# Patient Record
Sex: Male | Born: 1965 | Hispanic: Yes | Marital: Married | Smoking: Never smoker
Health system: Southern US, Community
[De-identification: ages and names within clinical notes are randomized; demographics above are authoritative.]

## PROBLEM LIST (undated history)

## (undated) DIAGNOSIS — G44209 Tension-type headache, unspecified, not intractable: Secondary | ICD-10-CM

## (undated) DIAGNOSIS — Z87442 Personal history of urinary calculi: Secondary | ICD-10-CM

## (undated) DIAGNOSIS — K219 Gastro-esophageal reflux disease without esophagitis: Secondary | ICD-10-CM

## (undated) DIAGNOSIS — C499 Malignant neoplasm of connective and soft tissue, unspecified: Secondary | ICD-10-CM

## (undated) HISTORY — PX: WRIST SURGERY: SHX841

## (undated) HISTORY — DX: Tension-type headache, unspecified, not intractable: G44.209

## (undated) HISTORY — PX: KNEE ARTHROSCOPY: SUR90

## (undated) HISTORY — DX: Gastro-esophageal reflux disease without esophagitis: K21.9

## (undated) HISTORY — DX: Malignant neoplasm of connective and soft tissue, unspecified: C49.9

## (undated) HISTORY — DX: Personal history of urinary calculi: Z87.442

---

## 1998-12-18 DIAGNOSIS — C499 Malignant neoplasm of connective and soft tissue, unspecified: Secondary | ICD-10-CM

## 1998-12-18 HISTORY — DX: Malignant neoplasm of connective and soft tissue, unspecified: C49.9

## 1999-12-19 DIAGNOSIS — Z859 Personal history of malignant neoplasm, unspecified: Secondary | ICD-10-CM | POA: Insufficient documentation

## 1999-12-19 HISTORY — PX: COLON SURGERY: SHX602

## 2005-05-01 ENCOUNTER — Ambulatory Visit: Payer: Self-pay | Admitting: Specialist

## 2005-10-18 ENCOUNTER — Ambulatory Visit: Payer: Self-pay | Admitting: Physician Assistant

## 2006-05-07 ENCOUNTER — Ambulatory Visit: Payer: Self-pay | Admitting: Oncology

## 2006-05-18 ENCOUNTER — Ambulatory Visit: Payer: Self-pay | Admitting: Oncology

## 2006-07-16 ENCOUNTER — Ambulatory Visit: Payer: Self-pay | Admitting: Oncology

## 2006-08-02 ENCOUNTER — Ambulatory Visit: Payer: Self-pay | Admitting: Oncology

## 2006-08-18 ENCOUNTER — Ambulatory Visit: Payer: Self-pay | Admitting: Oncology

## 2006-10-31 ENCOUNTER — Ambulatory Visit: Payer: Self-pay | Admitting: Oncology

## 2006-11-17 ENCOUNTER — Ambulatory Visit: Payer: Self-pay | Admitting: Oncology

## 2007-02-07 ENCOUNTER — Ambulatory Visit: Payer: Self-pay | Admitting: Oncology

## 2007-02-16 ENCOUNTER — Ambulatory Visit: Payer: Self-pay | Admitting: Oncology

## 2007-03-01 ENCOUNTER — Ambulatory Visit: Payer: Self-pay | Admitting: Gastroenterology

## 2007-11-21 ENCOUNTER — Ambulatory Visit: Payer: Self-pay | Admitting: Urology

## 2008-07-18 ENCOUNTER — Ambulatory Visit: Payer: Self-pay | Admitting: Oncology

## 2008-07-23 ENCOUNTER — Ambulatory Visit: Payer: Self-pay | Admitting: Oncology

## 2008-08-18 ENCOUNTER — Ambulatory Visit: Payer: Self-pay | Admitting: Oncology

## 2009-04-23 ENCOUNTER — Emergency Department: Payer: Self-pay | Admitting: Unknown Physician Specialty

## 2009-05-18 ENCOUNTER — Ambulatory Visit: Payer: Self-pay | Admitting: Oncology

## 2009-06-11 ENCOUNTER — Ambulatory Visit: Payer: Self-pay | Admitting: Oncology

## 2009-06-17 ENCOUNTER — Ambulatory Visit: Payer: Self-pay | Admitting: Oncology

## 2009-07-18 ENCOUNTER — Ambulatory Visit: Payer: Self-pay | Admitting: Oncology

## 2009-12-18 ENCOUNTER — Ambulatory Visit: Payer: Self-pay | Admitting: Oncology

## 2010-01-07 ENCOUNTER — Ambulatory Visit: Payer: Self-pay | Admitting: Oncology

## 2010-01-18 ENCOUNTER — Ambulatory Visit: Payer: Self-pay | Admitting: Oncology

## 2010-03-18 ENCOUNTER — Ambulatory Visit: Payer: Self-pay | Admitting: Oncology

## 2010-04-02 ENCOUNTER — Inpatient Hospital Stay: Payer: Self-pay | Admitting: Internal Medicine

## 2010-04-14 ENCOUNTER — Ambulatory Visit: Payer: Self-pay | Admitting: Oncology

## 2010-04-17 ENCOUNTER — Ambulatory Visit: Payer: Self-pay | Admitting: Oncology

## 2010-10-27 ENCOUNTER — Ambulatory Visit: Payer: Self-pay | Admitting: Oncology

## 2010-11-17 ENCOUNTER — Ambulatory Visit: Payer: Self-pay | Admitting: Oncology

## 2011-12-02 IMAGING — CR DG ABDOMEN 2V
1 series · 3 of 3 positions shown · non-contrast
Comparison: none

REASON FOR EXAM: F/U partial SBO.  (Please complete before 8 AM). Thanks.
COMMENTS:

[Series 1: view not recorded · 0.17mm/px · 3 of 3 slices shown]
[im 1/3]
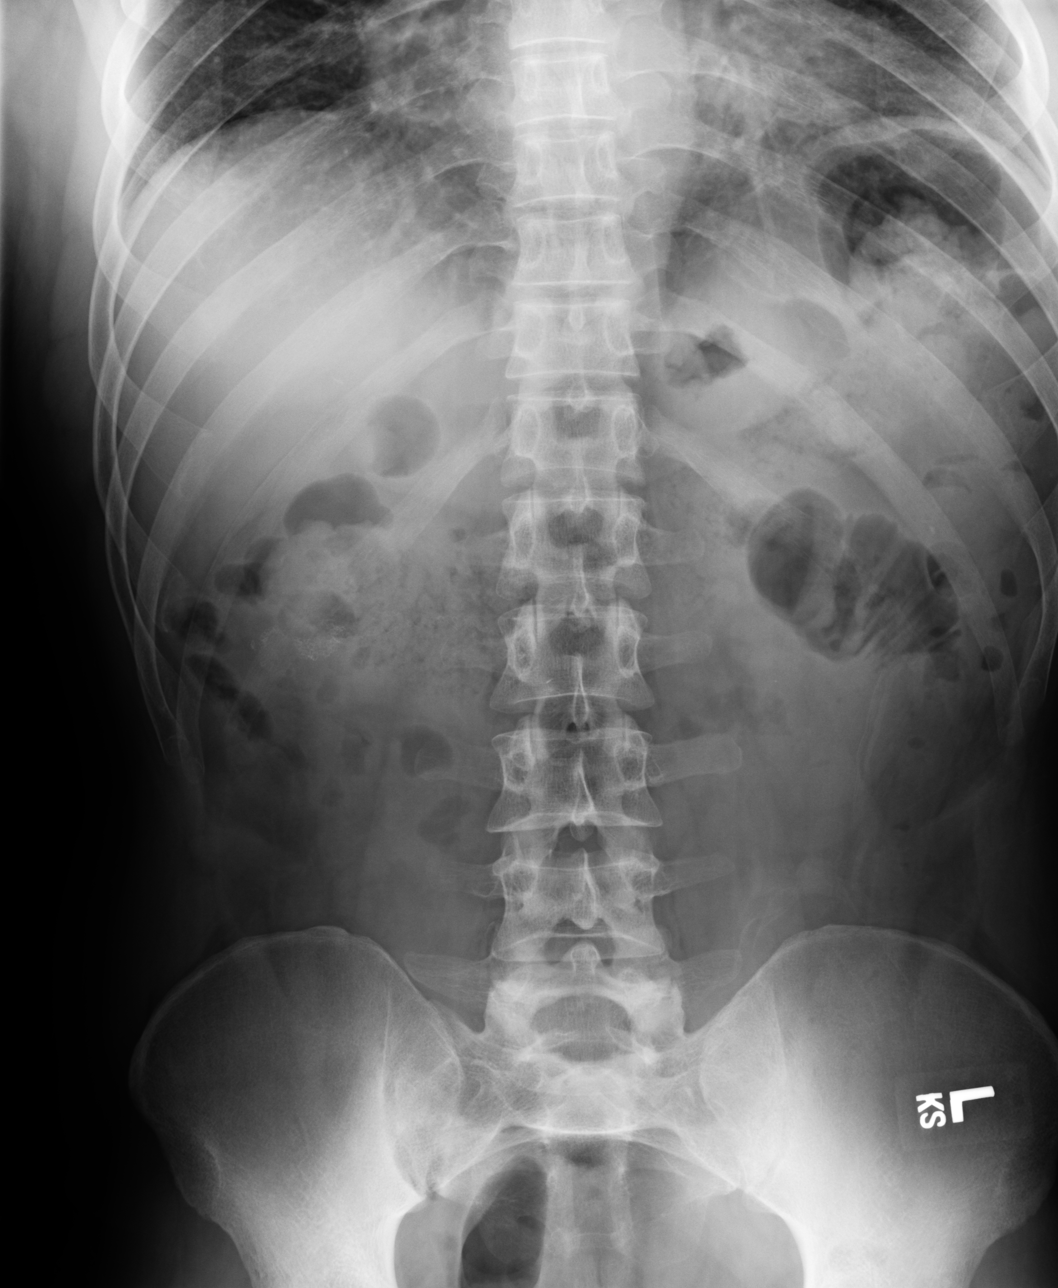
[im 2/3]
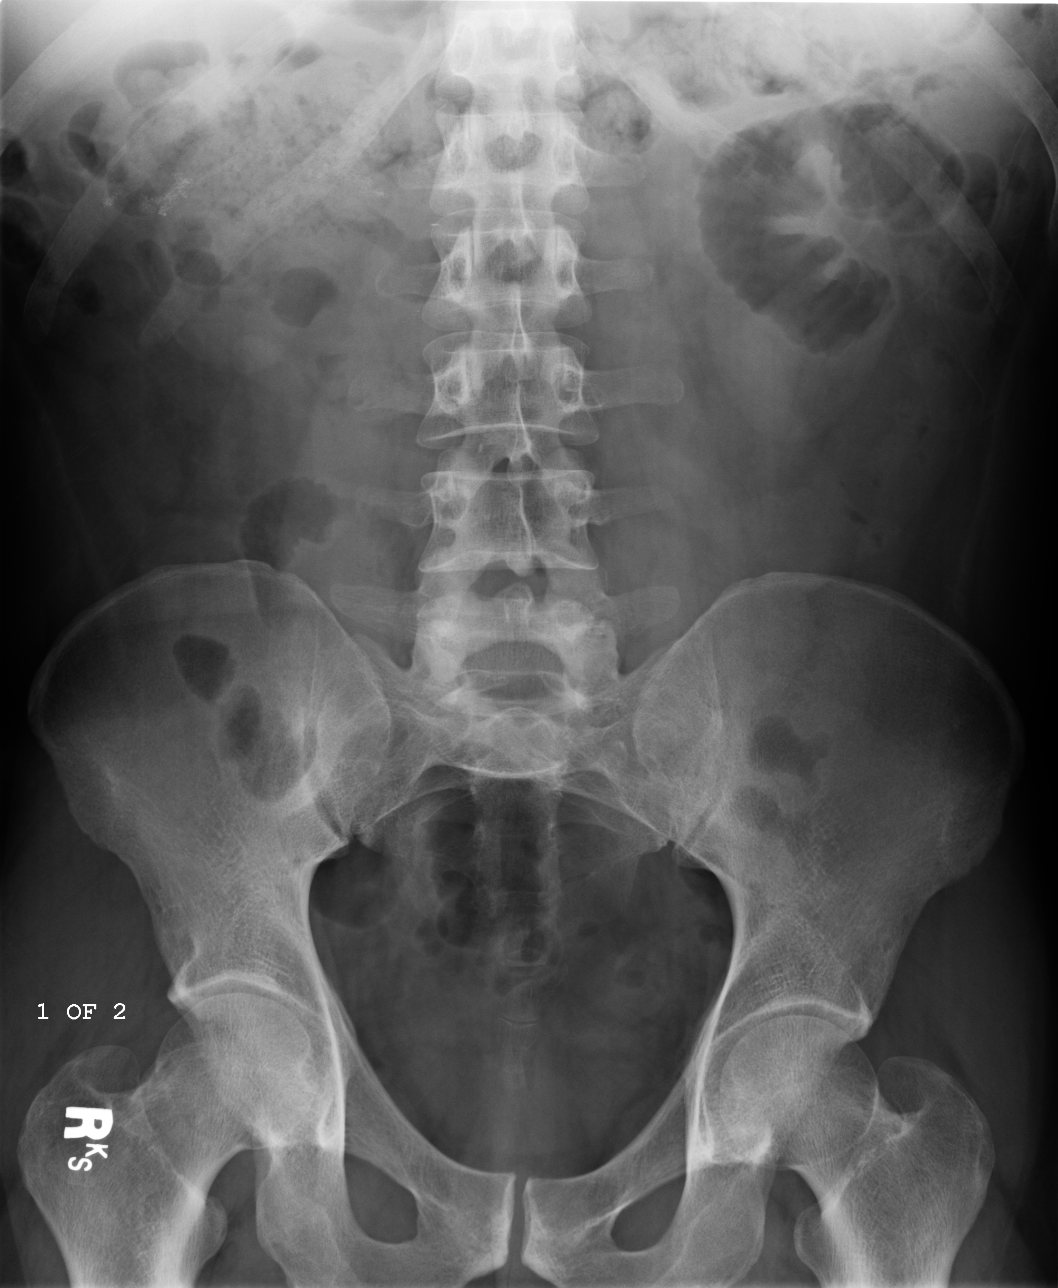
[im 3/3]
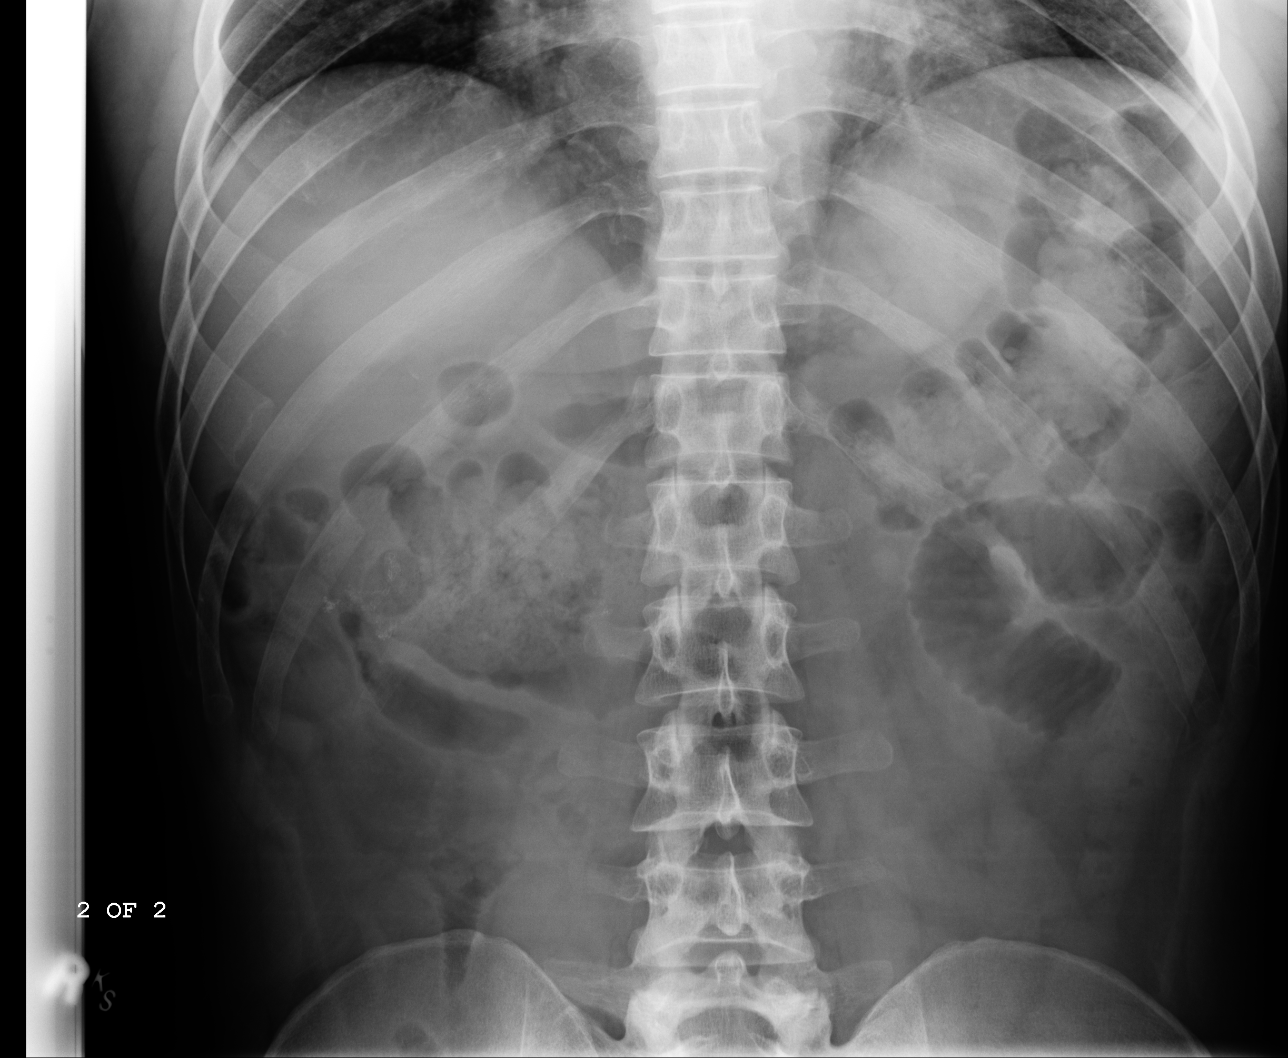

[3 of 3 positions shown; findings below may reference images not displayed]

PROCEDURE:     DXR - DXR ABDOMEN 2 V FLAT AND ERECT  - April 03, 2010  [DATE]

RESULT:     Comparison is made to the study of 04/02/2010.

Air and stool is seen in the colon to the rectosigmoid region. There is some
air within loops of small bowel in the left mid abdomen. The stomach is
nondistended. No free air is evident. There is some air demonstrated within
small bowel loops in the right mid abdomen as well. The bony structures are
unremarkable. The appearance is significantly improved compared to 04/02/2010.
IMPRESSION: No definite evidence of bowel obstruction or perforation.
Some air is seen within loops of small bowel in the mid abdominal regions.

## 2011-12-19 DIAGNOSIS — Z87442 Personal history of urinary calculi: Secondary | ICD-10-CM

## 2011-12-19 HISTORY — DX: Personal history of urinary calculi: Z87.442

## 2012-05-10 ENCOUNTER — Emergency Department: Payer: Self-pay | Admitting: *Deleted

## 2012-12-18 HISTORY — PX: COLON SURGERY: SHX602

## 2013-03-06 LAB — LIPID PANEL
Cholesterol: 214
HDL: 39 mg/dL (ref 35–70)
LDL (calc): 110
Triglycerides: 325

## 2013-03-06 LAB — COMPREHENSIVE METABOLIC PANEL
ALT: 49
AST: 33 U/L
Alkaline Phosphatase: 79 U/L
BUN: 10 mg/dL (ref 4–21)
Creat: 0.92
SODIUM: 138
Total Bilirubin: 0.7 mg/dL

## 2013-03-06 LAB — PSA: PSA: 1.7

## 2013-03-06 LAB — CBC
HGB: 16.8 g/dL
PLATELETS: 274
WBC: 6

## 2013-03-06 LAB — TSH: TSH: 1.49 u[IU]/mL (ref 0.41–5.90)

## 2015-04-23 ENCOUNTER — Encounter (INDEPENDENT_AMBULATORY_CARE_PROVIDER_SITE_OTHER): Payer: Self-pay

## 2015-04-23 ENCOUNTER — Ambulatory Visit (INDEPENDENT_AMBULATORY_CARE_PROVIDER_SITE_OTHER): Payer: BLUE CROSS/BLUE SHIELD | Admitting: Family Medicine

## 2015-04-23 ENCOUNTER — Encounter: Payer: Self-pay | Admitting: Family Medicine

## 2015-04-23 VITALS — BP 110/70 | HR 84 | Temp 98.5°F | Ht 67.25 in | Wt 171.0 lb

## 2015-04-23 DIAGNOSIS — G44229 Chronic tension-type headache, not intractable: Secondary | ICD-10-CM | POA: Diagnosis not present

## 2015-04-23 DIAGNOSIS — C499 Malignant neoplasm of connective and soft tissue, unspecified: Secondary | ICD-10-CM | POA: Diagnosis not present

## 2015-04-23 DIAGNOSIS — R059 Cough, unspecified: Secondary | ICD-10-CM | POA: Insufficient documentation

## 2015-04-23 DIAGNOSIS — R05 Cough: Secondary | ICD-10-CM

## 2015-04-23 DIAGNOSIS — G44209 Tension-type headache, unspecified, not intractable: Secondary | ICD-10-CM | POA: Insufficient documentation

## 2015-04-23 DIAGNOSIS — K219 Gastro-esophageal reflux disease without esophagitis: Secondary | ICD-10-CM

## 2015-04-23 MED ORDER — BENZONATATE 100 MG PO CAPS: 100.0000 mg | ORAL_CAPSULE | Freq: Three times a day (TID) | ORAL | Status: DC | PRN

## 2015-04-23 MED ORDER — OMEPRAZOLE 40 MG PO CPDR: 40.0000 mg | DELAYED_RELEASE_CAPSULE | Freq: Every day | ORAL | Status: DC

## 2015-04-23 MED ORDER — CYCLOBENZAPRINE HCL 5 MG PO TABS: 5.0000 mg | ORAL_TABLET | Freq: Every evening | ORAL | Status: DC | PRN

## 2015-04-23 NOTE — Patient Instructions (Addendum)
Return at your convenience for physical over next few months. For cough - tessalon perls to suppress cough and omeprazole for reflux. For headaches - try flexeril muscle relaxant at night time (5mg  daily). Let us know how you're doing.  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

## 2015-04-23 NOTE — Progress Notes (Addendum)
BP 110/70 mmHg  Pulse 84  Temp(Src) 98.5 F (36.9 C) (Oral)  Ht 5' 7.25" (1.708 m)  Wt 171 lb (77.565 kg)  BMI 26.59 kg/m2   CC: new pt to establish care Subjective:    Patient ID: Shane Combs, male    DOB: Aug 11, 1966, 49 y.o.   MRN: [id number]  HPI: Shane Combs is a 49 y.o. male presenting on 04/23/2015 for Establish Care   New pt to establish - prior saw Dr. Berlin Hun at [facility].  Recent laryngitis and cough x2 wks ago. Saw prior PCP, treated with dulera and flonase. Didn't help. Persistent cough with mild sputum production but improving some. No recent fevers/chills. No h/o asthma. No h/o smoking. Initial wheeze but no pain.   AM headache regularly for last 3 years, previously thought TTH. Improve after shower. Pain bilateral head that starts in neck. Tight neck, stiffness. Occasional dizziness. No vision changes. Self treats with ibuprofen.   GERD - worse with acid/spicy foods. prilosec was helpful but he's not currently taking.   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   Noticing some weakness over last 2 months. Didn't improve with MVI.   Preventative: Last CPE about 1 yr ago  Lives with wife and 2 daughters, 2 dogs Edu: HS Occ: Wellsite geologist Activity: gym QOD Diet: good water, fruits/vegetables daily  Relevant past medical, surgical, family and social history reviewed and updated as indicated. Interim medical history since our last visit reviewed. Allergies and medications reviewed and updated. No current outpatient prescriptions on file prior to visit.   No current facility-administered medications on file prior to visit.   Past Medical History  Diagnosis Date  . Tension type headache   . GERD (gastroesophageal reflux disease)   . History of kidney stones 2013  . Liposarcoma 2000    s/p colon resection - [place]    Past Surgical History  Procedure  Laterality Date  . Colon surgery  2014    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[place], saw oncologist x 6 yrs  . Wrist surgery Right 1990s  . Knee arthroscopy Left 1990s    meniscal repair   History  Substance Use Topics  . Smoking status: Never Smoker   . Smokeless tobacco: Never Used  . Alcohol Use: No    Review of Systems Per HPI unless specifically indicated above     Objective:    BP 110/70 mmHg  Pulse 84  Temp(Src) 98.5 F (36.9 C) (Oral)  Ht 5' 7.25" (1.708 m)  Wt 171 lb (77.565 kg)  BMI 26.59 kg/m2  Wt Readings from Last 3 Encounters:  04/23/15 171 lb (77.565 kg)    Physical Exam  Constitutional: He is oriented to person, place, and time. He appears well-developed and well-nourished. No distress.  HENT:  Head: Normocephalic and atraumatic.  Right Ear: Hearing, tympanic membrane, external ear and ear canal normal.  Left Ear: Hearing, tympanic membrane, external ear and ear canal normal.  Nose: Nose normal. No mucosal edema or rhinorrhea. Right sinus exhibits no maxillary sinus tenderness and no frontal sinus tenderness. Left sinus exhibits no maxillary sinus tenderness and no frontal sinus tenderness.  Mouth/Throat: Uvula is midline and mucous membranes are normal. Posterior oropharyngeal erythema (raw oropharynx) present. No oropharyngeal exudate, posterior oropharyngeal edema or tonsillar abscesses.  Some PNDrainage present  Eyes: Conjunctivae and EOM are normal. Pupils are equal, round, and reactive to light. No scleral icterus.  Neck: Normal range of motion. Neck supple. No thyromegaly present.  Cardiovascular: Normal rate, regular rhythm, normal heart sounds and intact distal pulses.   No murmur heard. Pulmonary/Chest: Effort normal and breath sounds normal. No respiratory distress. He has no wheezes. He has no rales.  Musculoskeletal: He exhibits no edema.  Clubbing of fingernails present  Lymphadenopathy:    He has no cervical adenopathy.  Neurological: He is alert and oriented to  person, place, and time. He has normal strength. No cranial nerve deficit or sensory deficit.  CN 2-12 intact FTN intact  Skin: Skin is warm and dry. No rash noted.  Nursing note and vitals reviewed.  No results found for this or any previous visit.    Assessment & Plan:   Problem List Items Addressed This Visit    TTH (tension-type headache)    Anticipate muscular headaches, but strange they happen every morning. Nonfocal neurological exam today. Will treat with flexeril 5mg  nightly and monitor for improvement.      Relevant Medications   cyclobenzaprine (FLEXERIL) 5 MG tablet   Liposarcoma    Given history, will refer to oncologist to establish locally, pt states he was advised to continue f/u with them, has not seen in last several years.      Relevant Orders   Ambulatory referral to Oncology   GERD (gastroesophageal reflux disease)    Persistent intermittent sxs, may be contributing to cough - treat with omeprazole 40mg  daily x 3 wks then PRN      Relevant Medications   omeprazole (PRILOSEC) 40 MG capsule   Cough - Primary    Ongoing over last 2 weeks - started after what sounds like viral URI.  Will treat with tessalon perls. Given GERD history - add omeprazole 40mg  daily x 3 wks then PRN. Pt agrees with plan. Clubbing noted on exam, but pt denies COPD or smoking history. Check O2 sat next visit.          Follow up plan: Return in about 3 months (around 07/24/2015), or as needed, for annual exam, prior fasting for blood work.

## 2015-04-23 NOTE — Assessment & Plan Note (Signed)
Anticipate muscular headaches, but strange they happen every morning. Nonfocal neurological exam today. Will treat with flexeril 5mg  nightly and monitor for improvement.

## 2015-04-23 NOTE — Assessment & Plan Note (Signed)
Ongoing over last 2 weeks - started after what sounds like viral URI.  Will treat with tessalon perls. Given GERD history - add omeprazole 40mg  daily x 3 wks then PRN. Pt agrees with plan. Clubbing noted on exam, but pt denies COPD or smoking history. Check O2 sat next visit.

## 2015-04-23 NOTE — Assessment & Plan Note (Signed)
Persistent intermittent sxs, may be contributing to cough - treat with omeprazole 40mg  daily x 3 wks then PRN

## 2015-04-23 NOTE — Assessment & Plan Note (Addendum)
Given history, will refer to oncologist to establish locally, pt states he was advised to continue f/u with them, has not seen in last several years.

## 2015-04-23 NOTE — Progress Notes (Signed)
Pre visit review using our clinic review tool, if applicable. No additional management support is needed unless otherwise documented below in the visit note. 

## 2015-05-03 ENCOUNTER — Inpatient Hospital Stay: Payer: BLUE CROSS/BLUE SHIELD | Attending: Oncology | Admitting: Oncology

## 2015-05-03 ENCOUNTER — Ambulatory Visit
Admission: RE | Admit: 2015-05-03 | Discharge: 2015-05-03 | Disposition: A | Discharge status: Home / Self Care | Payer: BLUE CROSS/BLUE SHIELD | Source: Ambulatory Visit | Attending: Oncology | Admitting: Oncology

## 2015-05-03 ENCOUNTER — Other Ambulatory Visit: Payer: BLUE CROSS/BLUE SHIELD | Admitting: Oncology

## 2015-05-03 VITALS — BP 128/82 | HR 74 | Temp 95.6°F | Ht 67.0 in | Wt 169.8 lb

## 2015-05-03 DIAGNOSIS — R05 Cough: Secondary | ICD-10-CM | POA: Diagnosis not present

## 2015-05-03 DIAGNOSIS — Z923 Personal history of irradiation: Secondary | ICD-10-CM | POA: Insufficient documentation

## 2015-05-03 DIAGNOSIS — C499 Malignant neoplasm of connective and soft tissue, unspecified: Secondary | ICD-10-CM | POA: Diagnosis not present

## 2015-05-03 DIAGNOSIS — Z8589 Personal history of malignant neoplasm of other organs and systems: Secondary | ICD-10-CM | POA: Diagnosis not present

## 2015-05-03 DIAGNOSIS — R059 Cough, unspecified: Secondary | ICD-10-CM

## 2015-05-03 DIAGNOSIS — D819 Combined immunodeficiency, unspecified: Secondary | ICD-10-CM

## 2015-05-05 ENCOUNTER — Other Ambulatory Visit: Payer: Self-pay | Admitting: Oncology

## 2015-05-05 ENCOUNTER — Ambulatory Visit
Admission: RE | Admit: 2015-05-05 | Discharge: 2015-05-05 | Disposition: A | Discharge status: Home / Self Care | Payer: BLUE CROSS/BLUE SHIELD | Source: Ambulatory Visit | Attending: Oncology | Admitting: Oncology

## 2015-05-05 DIAGNOSIS — R059 Cough, unspecified: Secondary | ICD-10-CM

## 2015-05-05 DIAGNOSIS — R05 Cough: Secondary | ICD-10-CM | POA: Diagnosis not present

## 2015-05-05 DIAGNOSIS — D819 Combined immunodeficiency, unspecified: Secondary | ICD-10-CM

## 2015-05-09 ENCOUNTER — Encounter: Payer: Self-pay | Admitting: Oncology

## 2015-05-09 NOTE — Progress Notes (Signed)
   DIAGNOSIS:   Chief Complaint/Diagnosis Primary Care Physician:  Lanae Boast MD  Chief Complaint/Problem List:  Patient had a previous surgery for liposarcoma of abdomen, status-post resection and radiation therapy in [place]. , exact stage not being known   Current Treatment Plan:   Treatment Plan Observation   HISTORY OF PRESENT ILLNESS:   HPI Patient is here to reestablish oncology care.  Last seen was 4 years ago.  Since that evaluation patient does not have any significant new problem.  Has recent dry hacking cough.. No hemoptysis.  Appetite has been stable.  No abdominal pain.    PFSH:   Additional Past Medical and Surgical History Past Medical History: No significant past history of diabetes or hypertension.  Past Surgical History: History of surgery for 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December of 2000.  Family History: No family history of colorectal cancer, breast cancer, or ovarian cancer.  Social History: No alcohol use. No tobacco use. No recreational drug use.   ALLERGIES:  ALLERGIES:  PCN: Rash  HOME MEDICATIONS:   omeprazole 20 mg oral enteric coated tablet: 1 tab(s) orally once a day x 30 days   Review of Systems:   Review of Systems GENERAL EXAM: No chills, no fever. Energy level steady. HEENT: No headache.  No soreness in the mouth. No dysphagia. LUNGS: No SOB. No hemoptysis.  Senna as dry hacking cough. ABDOMEN: No bleeding. No pain. No distention. SKIN: No rash. EXTREMITIES: No edema. Neurological system: No headache no dizziness.  Lower extremity no edema.  GU: No dysuria or hematuria.     Nursing Vital Signs and Chemo Flowsheets: *CC Vital Signs Flowsheet: Vital signs have been reviewed                                              Physical Exam:   Physical Exam GENERAL EXAM: Good performance status. LYMPHATIC SYSTEM: No palpable supraclavicular, cervical, axillary, or inguinal lymph nodes. LUNGS: Air entry equal on both sides.  No  dullness on percussion. No rales. No rhonchi. ABDOMEN: Liver not palpable. Spleen not palpable. No masses. No tenderness. No ascites. SKIN: No ecchymosis. No rash. No petechial hemorrhage.  CARDIOVASCULAR:   Regular rate and rhythm. S1 and S2 without murmur, gallop or rub. Neurologically, the patient was awake, alert, and oriented to person, place and time. There were no obvious focal neurologic abnormalities.     Lab data from the outside institution has been reviewed and no abnormality detected  RELEVANT RESULTS: Chest x-ray was reviewed independently no evidence of recurrent or progressive disease  No evidence of metastases disease . ASSESSMENT AND PLAN:   Impression liposarcoma status post surgery and radiation therapy   No evidence of recurrent disease.  ew problem developes   Chest x-rays been reviewed independently sows no evidence of metastases.  Patient was advised to continue routine colonoscopy and evaluation

## 2015-06-01 ENCOUNTER — Encounter: Payer: Self-pay | Admitting: Medical Oncology

## 2015-06-01 ENCOUNTER — Emergency Department: Payer: BLUE CROSS/BLUE SHIELD

## 2015-06-01 ENCOUNTER — Emergency Department
Admission: EM | Admit: 2015-06-01 | Discharge: 2015-06-01 | Disposition: A | Discharge status: Home / Self Care | Payer: BLUE CROSS/BLUE SHIELD | Attending: Emergency Medicine | Admitting: Emergency Medicine

## 2015-06-01 DIAGNOSIS — K219 Gastro-esophageal reflux disease without esophagitis: Secondary | ICD-10-CM | POA: Diagnosis not present

## 2015-06-01 DIAGNOSIS — Z79899 Other long term (current) drug therapy: Secondary | ICD-10-CM | POA: Insufficient documentation

## 2015-06-01 DIAGNOSIS — R112 Nausea with vomiting, unspecified: Secondary | ICD-10-CM | POA: Diagnosis present

## 2015-06-01 LAB — COMPREHENSIVE METABOLIC PANEL
ALK PHOS: 95 U/L (ref 38–126)
ALT: 58 U/L (ref 17–63)
AST: 40 U/L (ref 15–41)
Albumin: 4.4 g/dL (ref 3.5–5.0)
Anion gap: 8 (ref 5–15)
BUN: 19 mg/dL (ref 6–20)
CHLORIDE: 105 mmol/L (ref 101–111)
CO2: 25 mmol/L (ref 22–32)
CREATININE: 0.93 mg/dL (ref 0.61–1.24)
Calcium: 9.2 mg/dL (ref 8.9–10.3)
GFR calc non Af Amer: >60 mL/min (ref >60)
Glucose, Bld: 127 mg/dL — ABNORMAL HIGH (ref 65–99)
POTASSIUM: 3.8 mmol/L (ref 3.5–5.1)
SODIUM: 138 mmol/L (ref 135–145)
Total Bilirubin: 0.7 mg/dL (ref 0.3–1.2)
Total Protein: 7.6 g/dL (ref 6.5–8.1)

## 2015-06-01 LAB — CBC WITH DIFFERENTIAL/PLATELET
BASOS ABS: 0 10*3/uL (ref 0–0.1)
Basophils Relative: 0 %
Eosinophils Absolute: 0 10*3/uL (ref 0–0.7)
Eosinophils Relative: 0 %
HEMATOCRIT: 45.9 % (ref 40.0–52.0)
Hemoglobin: 15.4 g/dL (ref 13.0–18.0)
LYMPHS ABS: 1.6 10*3/uL (ref 1.0–3.6)
LYMPHS PCT: 13 %
MCH: 30.5 pg (ref 26.0–34.0)
MCHC: 33.6 g/dL (ref 32.0–36.0)
MCV: 90.8 fL (ref 80.0–100.0)
Monocytes Absolute: 0.5 10*3/uL (ref 0.2–1.0)
Monocytes Relative: 4 %
NEUTROS ABS: 10 10*3/uL — AB (ref 1.4–6.5)
Neutrophils Relative %: 83 %
Platelets: 229 10*3/uL (ref 150–440)
RBC: 5.05 MIL/uL (ref 4.40–5.90)
RDW: 13.2 % (ref 11.5–14.5)
WBC: 12.1 10*3/uL — AB (ref 3.8–10.6)

## 2015-06-01 LAB — URINALYSIS COMPLETE WITH MICROSCOPIC (ARMC ONLY)
BACTERIA UA: NONE SEEN
Bilirubin Urine: NEGATIVE
GLUCOSE, UA: NEGATIVE mg/dL
HGB URINE DIPSTICK: NEGATIVE
Ketones, ur: NEGATIVE mg/dL
Leukocytes, UA: NEGATIVE
Nitrite: NEGATIVE
PROTEIN: NEGATIVE mg/dL
SPECIFIC GRAVITY, URINE: 1.021 (ref 1.005–1.030)
pH: 5 (ref 5.0–8.0)

## 2015-06-01 LAB — LIPASE, BLOOD: Lipase: 35 U/L (ref 22–51)

## 2015-06-01 MED ORDER — RANITIDINE HCL 150 MG PO CAPS: 150.0000 mg | ORAL_CAPSULE | Freq: Two times a day (BID) | ORAL | Status: DC

## 2015-06-01 MED ORDER — RANITIDINE HCL 150 MG PO TABS
300.0000 mg | ORAL_TABLET | Freq: Once | ORAL | Status: AC
  Administered 2015-06-01: 300 mg via ORAL

## 2015-06-01 MED ORDER — GI COCKTAIL ~~LOC~~
ORAL | Status: AC
  Filled 2015-06-01: qty 30

## 2015-06-01 MED ORDER — ONDANSETRON 8 MG PO TBDP
8.0000 mg | ORAL_TABLET | Freq: Once | ORAL | Status: AC
  Administered 2015-06-01: 8 mg via ORAL

## 2015-06-01 MED ORDER — NON FORMULARY: 300.0000 mg | Freq: Once | Status: DC

## 2015-06-01 MED ORDER — SUCRALFATE 1 G PO TABS: 1.0000 g | ORAL_TABLET | Freq: Four times a day (QID) | ORAL | Status: DC

## 2015-06-01 MED ORDER — GI COCKTAIL ~~LOC~~
30.0000 mL | ORAL | Status: AC
  Administered 2015-06-01: 30 mL via ORAL

## 2015-06-01 MED ORDER — RANITIDINE HCL 150 MG PO TABS
ORAL_TABLET | ORAL | Status: AC
  Administered 2015-06-01: 300 mg via ORAL
  Filled 2015-06-01: qty 2

## 2015-06-01 MED ORDER — ONDANSETRON 8 MG PO TBDP
ORAL_TABLET | ORAL | Status: AC
  Administered 2015-06-01: 8 mg via ORAL
  Filled 2015-06-01: qty 1

## 2015-06-01 NOTE — ED Notes (Signed)
Pt discharged home after verbalizing understanding of discharge instructions; nad noted. 

## 2015-06-01 NOTE — ED Provider Notes (Signed)
[facility] Emergency Department Provider Note  ____________________________________________  Time seen: 10:20 AM  I have reviewed the triage vital signs and the nursing notes.   HISTORY  Chief Complaint Emesis    HPI Shane Combs is a 49 y.o. male who reports upper abdominal pain with nausea vomiting diarrhea since about midnight last night after eating [place] food for dinner yesterday. He reports that he can sit commonly has the symptoms after eating [place] food. No other postprandial symptoms. No chest pain shortness of breath fevers chills or syncope. No back pain. He is currently feeling a little bit better.     Past Medical History  Diagnosis Date  . Tension type headache   . GERD (gastroesophageal reflux disease)   . History of kidney stones 2013  . Liposarcoma 2000    s/p colon resection - [place]    Patient Active Problem List   Diagnosis Date Noted  . Cough 04/23/2015  . Liposarcoma   . GERD (gastroesophageal reflux disease)   . TTH (tension-type headache)     Past Surgical History  Procedure Laterality Date  . Colon surgery  2014    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[place], saw oncologist x 6 yrs  . Wrist surgery Right 1990s  . Knee arthroscopy Left 1990s    meniscal repair    Current Outpatient Rx  Name  Route  Sig  Dispense  Refill  . omeprazole (PRILOSEC) 40 MG capsule   Oral   Take 1 capsule (40 mg total) by mouth daily. For 3 weeks then as needed for heartburn   30 capsule   3   . ranitidine (ZANTAC) 150 MG capsule   Oral   Take 1 capsule (150 mg total) by mouth 2 (two) times daily.   28 capsule   0   . sucralfate (CARAFATE) 1 G tablet   Oral   Take 1 tablet (1 g total) by mouth 4 (four) times daily.   120 tablet   1     Allergies Review of patient's allergies indicates no known allergies.  Family History  Problem Relation Age of Onset  . Cancer Neg Hx   . Hypertension Neg Hx   . Diabetes Neg Hx   . CAD Neg Hx   .  Stroke Neg Hx     Social History History  Substance Use Topics  . Smoking status: Never Smoker   . Smokeless tobacco: Never Used  . Alcohol Use: No    Review of Systems  Constitutional: No fever or chills. No weight changes Eyes:No blurry vision or double vision.  ENT: No sore throat. Cardiovascular: No chest pain. Respiratory: No dyspnea or cough. Gastrointestinal: Upper abdominal pain with vomiting and diarrhea as above.  No BRBPR or melena. Genitourinary: Negative for dysuria, urinary retention, bloody urine, or difficulty urinating. Musculoskeletal: Negative for back pain. No joint swelling or pain. Skin: Negative for rash. Neurological: Negative for headaches, focal weakness or numbness. Psychiatric:No anxiety or depression.   Endocrine:No hot/cold intolerance, changes in energy, or sleep difficulty.  10-point ROS otherwise negative.  ____________________________________________   PHYSICAL EXAM:  VITAL SIGNS: ED Triage Vitals  Enc Vitals Group     BP 06/01/15 0747 135/86 mmHg     Pulse Rate 06/01/15 0747 79     Resp 06/01/15 0747 18     Temp 06/01/15 0747 97.9 F (36.6 C)     Temp Source 06/01/15 0747 Oral     SpO2 06/01/15 0747 99 %  Weight 06/01/15 0747 170 lb (77.111 kg)     Height 06/01/15 0747 5\' 8"  (1.727 m)     Head Cir --      Peak Flow --      Pain Score 06/01/15 0747 10     Pain Loc --      Pain Edu? --      Excl. in [place]? --      Constitutional: Alert and oriented. Well appearing and in no distress. Eyes: No scleral icterus. No conjunctival pallor. PERRL. EOMI ENT   Head: Normocephalic and atraumatic.   Nose: No congestion/rhinnorhea. No septal hematoma   Mouth/Throat: MMM, no pharyngeal erythema. No peritonsillar mass. No uvula shift.   Neck: No stridor. No SubQ emphysema. No meningismus. Hematological/Lymphatic/Immunilogical: No cervical lymphadenopathy. Cardiovascular: RRR. Normal and symmetric distal pulses are present  in all extremities. No murmurs, rubs, or gallops. Respiratory: Normal respiratory effort without tachypnea nor retractions. Breath sounds are clear and equal bilaterally. No wheezes/rales/rhonchi. Gastrointestinal: Soft with epigastric and right upper quadrant tenderness. Murphy's negative.. No distention. There is no CVA tenderness.  No rebound, rigidity, or guarding. Genitourinary: deferred Musculoskeletal: Nontender with normal range of motion in all extremities. No joint effusions.  No lower extremity tenderness.  No edema. Neurologic:   Normal speech and language.  CN 2-10 normal. Motor grossly intact. No pronator drift.  Normal gait. No gross focal neurologic deficits are appreciated.  Skin:  Skin is warm, dry and intact. No rash noted.  No petechiae, purpura, or bullae. Psychiatric: Mood and affect are normal. Speech and behavior are normal. Patient exhibits appropriate insight and judgment.  ____________________________________________    LABS (pertinent positives/negatives) (all labs ordered are listed, but only abnormal results are displayed) Labs Reviewed  CBC WITH DIFFERENTIAL/PLATELET - Abnormal; Notable for the following:    WBC 12.1 (*)    Neutro Abs 10.0 (*)    All other components within normal limits  COMPREHENSIVE METABOLIC PANEL - Abnormal; Notable for the following:    Glucose, Bld 127 (*)    All other components within normal limits  URINALYSIS COMPLETEWITH MICROSCOPIC ([facility] ONLY) - Abnormal; Notable for the following:    Color, Urine YELLOW (*)    APPearance CLEAR (*)    Squamous Epithelial / LPF 0-5 (*)    All other components within normal limits  LIPASE, BLOOD   ____________________________________________   EKG    ____________________________________________    RADIOLOGY    ____________________________________________   PROCEDURES  ____________________________________________   INITIAL IMPRESSION / ASSESSMENT AND PLAN / ED  COURSE  Pertinent labs & imaging results that were available during my care of the patient were reviewed by me and considered in my medical decision making (see chart for details).  Evaluation concerning for pancreatitis or cholecystitis. Vital signs are currently normal but with significant right upper quadrant tenderness we'll get an ultrasound as well as labs. His symptoms are most consistent with GERD and will give him a GI cocktail and Zantac in the meantime. ----------------------------------------- 1:21 PM on 06/01/2015 ----------------------------------------- Patient feels much better. Ultrasound not yet completed and patient refuses to wait any longer and says he would like to go home and will follow up with his primary care doctor. He is ambulatory, tolerating by mouth, stable and not in distress. Labs and there is a workup are unremarkable. Evaluation is consistent with GERD low suspicion of cholecystitis or sepsis or surgical pathology, and the patient has medical decision-making capacity. At this point with his symptoms controlled with it's  reasonable to forego the ultrasound and follow-up with primary care. We'll give him prescriptions for Carafate and Zantac.    ____________________________________________   FINAL CLINICAL IMPRESSION(S) / ED DIAGNOSES  Final diagnoses:  Gastroesophageal reflux disease without esophagitis      Carrie Mew, MD 06/01/15 1322

## 2015-06-01 NOTE — Discharge Instructions (Signed)
Reflujo gastroesofgico - Adultos  (Gastroesophageal Reflux Disease, Adult)  El reflujo gastroesofgico ocurre cuando el cido del estmago pasa al esfago. Cuando el cido entra en contacto con el esfago, el cido provoca dolor (inflamacin) en el esfago. Con el tiempo, pueden formarse pequeos agujeros (lceras) en el revestimiento del esfago. CAUSAS   Exceso de Engineer, site. Esto aplica presin [employer], lo que hace que el cido del estmago suba hacia el esfago.  El hbito de fumar [place] la produccin de cido en el [place].  El consumo de alcohol. Provoca disminucin de la presin en el esfnter esofgico inferior (vlvula o anillo de msculo entre el esfago y Product manager), permitiendo que el cido del estmago suba hacia el esfago.  Cenas a ltima hora del da y estmago lleno. [place]umenta la presin y la produccin de cido en el estmago.  Malformacin en el esfnter esofgico inferior. A menudo no se halla causa.  SNTOMAS   Ardor y Aeronautical engineer parte inferior del pecho detrs del esternn y en la zona media del [place]. Puede ocurrir [employer] por semana o ms a menudo.  Dificultad para tragar.  Dolor de Investment banker, operational.  Tos seca.  Sntomas similares al asma que incluyen sensacin de opresin en el pecho, falta de aire y sibilancias. DIAGNSTICO  El mdico diagnosticar el problema basndose en los sntomas. En algunos casos, se indican radiografas y otras pruebas para verificar si hay complicaciones o para comprobar el estado del estmago y Education administrator.  TRATAMIENTO  El mdico le indicar medicamentos de venta libre o recetados para ayudar a disminuir la produccin de cido. Consulte con su mdico antes de Art gallery manager o agregar cualquier medicamento nuevo.  INSTRUCCIONES PARA EL CUIDADO EN EL HOGAR   Modifique los factores que pueda cambiar. Consulte con su mdico para solicitar orientacin relacionada con la prdida de peso, dejar de fumar y el consumo de  alcohol.  Evite las comidas y bebidas que empeoran los [place], [place]:  [place] con cafena o alcohlicas.  Chocolate.  Sabores a English as a second language teacher.  Ajo y cebolla.  Comidas muy condimentadas.  Ctricos como naranjas, limones o limas.  Alimentos que contengan tomate, como salsas, [place] y pizza.  Alimentos fritos y Radio broadcast assistant.  Evite acostarse durante 3 horas antes de irse a dormir o antes de tomar una siesta.  Haga comidas pequeas durante Psychiatrist de 3 comidas abundantes.  Use ropas sueltas. No use nada apretado alrededor de la cintura que cause presin en el estmago.  Levante (eleve) la cabecera de la cama 6 a 8 pulgadas (15 a 20 cm) con bloques de madera. Usar almohadas extra no ayuda.  Solo tome medicamentos que se pueden comprar sin receta o recetados para el dolor, Tree surgeon o fiebre, como le indica el mdico.  No tome aspirina, ibuprofeno ni antiinflamatorios no esteroides. [place] [employer] [place]:   [employer], el cuello, la [place], los dientes o la espalda.  El dolor aumenta o cambia la intensidad o la durancin.  Tiene nuseas, vmitos o sudoracin(diaforesis).  Siente falta de aire o dolor en el pecho, o se desmaya.  Vomita y el vmito tiene [place], es de color [place], [place], negro o es similar a la borra del caf o tiene [place].  Las heces son rojas, sanguinolentas o negras. Estos sntomas pueden ser signos de otros problemas, como enfermedades cardacas, hemorragias gstrias o sangrado esofgico.  ASEGRESE DE QUE:   Comprende estas instrucciones.  Controlar su enfermedad.  Solicitar ayuda de inmediato si no mejora o si empeora. Document Released: 09/13/2005 Document Revised: 02/26/2012 [facility] Patient Information 2015 [place]. This information is not intended to replace advice given to you by your health care provider. Make sure you discuss any questions you have with your health care provider.

## 2015-06-01 NOTE — ED Notes (Signed)
Pt sleeping. Pt wife leaving bedside and will return.

## 2015-06-01 NOTE — ED Notes (Signed)
Pt resting comfortably. States sx's improved with meds given. Wife at bedside. No needs at this time.

## 2015-06-01 NOTE — ED Notes (Signed)
Pt reports n/v/d since last night after eating chinese food.

## 2015-06-26 DIAGNOSIS — M722 Plantar fascial fibromatosis: Secondary | ICD-10-CM | POA: Insufficient documentation

## 2015-06-26 DIAGNOSIS — J309 Allergic rhinitis, unspecified: Secondary | ICD-10-CM | POA: Insufficient documentation

## 2015-07-21 ENCOUNTER — Encounter: Payer: Self-pay | Admitting: *Deleted

## 2015-07-24 ENCOUNTER — Other Ambulatory Visit: Payer: Self-pay | Admitting: Family Medicine

## 2015-07-24 DIAGNOSIS — C499 Malignant neoplasm of connective and soft tissue, unspecified: Secondary | ICD-10-CM

## 2015-07-24 DIAGNOSIS — Z13220 Encounter for screening for lipoid disorders: Secondary | ICD-10-CM

## 2015-07-24 DIAGNOSIS — K219 Gastro-esophageal reflux disease without esophagitis: Secondary | ICD-10-CM

## 2015-07-24 DIAGNOSIS — Z125 Encounter for screening for malignant neoplasm of prostate: Secondary | ICD-10-CM

## 2015-07-26 ENCOUNTER — Other Ambulatory Visit: Payer: BLUE CROSS/BLUE SHIELD

## 2015-07-29 ENCOUNTER — Encounter: Payer: Self-pay | Admitting: Family Medicine

## 2015-07-29 ENCOUNTER — Ambulatory Visit (INDEPENDENT_AMBULATORY_CARE_PROVIDER_SITE_OTHER): Payer: BLUE CROSS/BLUE SHIELD | Admitting: Family Medicine

## 2015-07-29 VITALS — BP 114/82 | HR 72 | Temp 98.2°F | Ht 67.25 in | Wt 170.0 lb

## 2015-07-29 DIAGNOSIS — Z125 Encounter for screening for malignant neoplasm of prostate: Secondary | ICD-10-CM | POA: Diagnosis not present

## 2015-07-29 DIAGNOSIS — N508 Other specified disorders of male genital organs: Secondary | ICD-10-CM

## 2015-07-29 DIAGNOSIS — C499 Malignant neoplasm of connective and soft tissue, unspecified: Secondary | ICD-10-CM | POA: Diagnosis not present

## 2015-07-29 DIAGNOSIS — Z13220 Encounter for screening for lipoid disorders: Secondary | ICD-10-CM | POA: Diagnosis not present

## 2015-07-29 DIAGNOSIS — Z0000 Encounter for general adult medical examination without abnormal findings: Secondary | ICD-10-CM

## 2015-07-29 DIAGNOSIS — R300 Dysuria: Secondary | ICD-10-CM | POA: Diagnosis not present

## 2015-07-29 DIAGNOSIS — N5089 Other specified disorders of the male genital organs: Secondary | ICD-10-CM | POA: Insufficient documentation

## 2015-07-29 LAB — BASIC METABOLIC PANEL
BUN: 15 mg/dL (ref 6–23)
CHLORIDE: 102 meq/L (ref 96–112)
CO2: 28 mEq/L (ref 19–32)
CREATININE: 0.87 mg/dL (ref 0.40–1.50)
Calcium: 9.6 mg/dL (ref 8.4–10.5)
GFR: 98.62 mL/min (ref >60.00)
GLUCOSE: 90 mg/dL (ref 70–99)
POTASSIUM: 3.9 meq/L (ref 3.5–5.1)
Sodium: 137 mEq/L (ref 135–145)

## 2015-07-29 LAB — PSA: PSA: 1.06 ng/mL (ref 0.10–4.00)

## 2015-07-29 LAB — LIPID PANEL
CHOL/HDL RATIO: 6
CHOLESTEROL: 205 mg/dL — AB (ref 0–200)
HDL: 36.2 mg/dL — ABNORMAL LOW (ref >39.00)
NonHDL: 168.44
TRIGLYCERIDES: 331 mg/dL — AB (ref 0.0–149.0)
VLDL: 66.2 mg/dL — AB (ref 0.0–40.0)

## 2015-07-29 LAB — TSH: TSH: 1.14 u[IU]/mL (ref 0.35–4.50)

## 2015-07-29 LAB — POCT URINALYSIS DIPSTICK
Bilirubin, UA: NEGATIVE
Glucose, UA: NEGATIVE
Ketones, UA: NEGATIVE
Leukocytes, UA: NEGATIVE
Nitrite, UA: NEGATIVE
PH UA: 6
PROTEIN UA: NEGATIVE
RBC UA: NEGATIVE
Urobilinogen, UA: 0.2

## 2015-07-29 LAB — LDL CHOLESTEROL, DIRECT: LDL DIRECT: 86 mg/dL

## 2015-07-29 NOTE — Addendum Note (Signed)
Addended by: Daralene Milch C on: 07/29/2015 11:56 AM   Modules accepted: SmartSet

## 2015-07-29 NOTE — Assessment & Plan Note (Signed)
Check UA today.  

## 2015-07-29 NOTE — Assessment & Plan Note (Addendum)
Monogamous relationship. Check RPR, HIV today

## 2015-07-29 NOTE — Progress Notes (Signed)
Pre visit review using our clinic review tool, if applicable. No additional management support is needed unless otherwise documented below in the visit note. 

## 2015-07-29 NOTE — Addendum Note (Signed)
Addended by: Royann Shivers A on: 07/29/2015 12:01 PM   Modules accepted: Orders
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
## 2015-07-29 NOTE — Assessment & Plan Note (Signed)
Appreciate onc care of patient. Overall stable recent eval.

## 2015-07-29 NOTE — Assessment & Plan Note (Signed)
Preventative protocols reviewed and updated unless pt declined. Discussed healthy diet and lifestyle.  

## 2015-07-29 NOTE — Patient Instructions (Addendum)
Blood work today. Urinalysis today. Laboratorios hoy. Gusto verlo hoy. Regresar como necesite o en 1 ao para proximo fisico.  Mantenimiento de Technical sales engineer (Health Maintenance) Un estilo de vida saludable y los cuidados preventivos pueden favorecer la salud y [place].  No deje de [employer] de rutina de la salud, dentales y de Public librarian.  Consuma una dieta saludable. Los [employer], frutas, cereales integrales, productos lcteos descremados y protenas magras contienen los nutrientes que usted necesita y no tienen muchas caloras. Disminuya la ingesta de alimentos ricos en grasas slidas, azcares y sal agregadas. Si es necesario, pdale informacin acerca de una dieta [place] a su mdico.  Realizar actividad fsica de forma regular es una de las prcticas ms importantes que puede hacer por su salud. Los adultos deben hacer al menos 150 minutos de ejercicios de intensidad moderada (cualquier actividad que aumente la frecuencia cardaca y lo haga transpirar) cada semana. Adems, la [employer] de los adultos necesita practicar ejercicios de fortalecimiento muscular dos o ms veces por semana.  Mantenga un peso saludable. El ndice de masa corporal [facility]) es una herramienta que identifica posibles problemas con [place]. Proporciona una estimacin de la grasa corporal basndose en el peso y la altura. El mdico podr determinar su [facility] y ayudarlo a Scientist, forensic o Theatre manager un peso saludable. Para los adultos mayores de 20aos:  Un [facility] menor de 18,5 se considera bajo peso.  Un [facility] entre 18,5 y 24,9 es normal.  Un [facility] entre 25 y 29,9 se considera sobrepeso.  Un IMC de 30 o ms se considera obesidad.  Mantenga un nivel normal de lpidos y colesterol en la sangre practicando actividad fsica y minimizando la ingesta de grasas saturadas. Consuma una dieta balanceada e incluya variedad de frutas y vegetales. A partir de los 20 aos se deben realizar anlisis de sangre a fin de  Freight forwarder nivel de lpidos y colesterol en la sangre y [place] cada 5 aos. Si los niveles de colesterol son altos, tiene ms de 50 aos o tiene riesgo elevado de sufrir enfermedades cardacas, Designer, industrial/product controlarse con ms frecuencia. Si tiene [employer] de lpidos y colesterol, debe recibir tratamiento con medicamentos, si la dieta y el ejercicio no estn funcionando.  Si fuma, consulte con el mdico acerca de las opciones para dejar de [place]. Si no fuma, no comience.  Se recomienda realizar exmenes de deteccin de cncer de pulmn a personas adultas entre 70 y 45 aos que estn en riesgo de Horticulturist, commercial de pulmn por sus antecedentes de consumo de tabaco. Para quienes hayan fumado durante 30 aos un paquete diario, y sigan fumando o hayan dejado el hbito en algn momento en los ltimos 15 aos, se recomienda realizarse una tomografa computada de baja dosis de los pulmones todos los [place]. Fumar un paquete por ao equivale a fumar un promedio de un paquete de cigarrillos diario durante un ao (por ejemplo, fumar 30paquetes por ao podra significar fumar un paquete de cigarrillos diario durante 30aos o 2paquetes diarios durante 15aos). Los exmenes anuales deben continuar hasta que el fumador haya dejado de fumar durante un mnimo de 15 aos. Ya no deben Emergency planning/management officer que tengan un problema de salud que les impida recibir tratamiento para el cncer de pulmn.  Si decide tomar alcohol, no beba ms de [employer]. Se considera una medida 12onzas (337m) de cerveza, 5onzas (1573m de vino o 1,8,[id number] licor.  Evite el consumo de drogas. No comparta  agujas. Pida ayuda si necesita asistencia o instrucciones con respecto a abandonar el consumo de drogas.  La hipertensin arterial causa enfermedades cardacas y [place] el riesgo de ictus. Debe controlar su presin arterial al menos cada uno o [place]. La presin arterial elevada que persiste debe  tratarse con medicamentos si la prdida de peso y el ejercicio no son efectivos.  Si tiene entre 40 y 71 aos, consulte a su mdico si debe tomar aspirina para prevenir enfermedades cardacas.  Los anlisis para la diabetes incluyen la toma de [place] de sangre para controlar el nivel de azcar en la sangre durante el [place]. Debe hacerlos cada 3aos despus de los 82aos si su peso es normal y no tiene factores de riesgo de diabetes. Las pruebas deben comenzar a edades tempranas o llevarse a cabo con ms frecuencia si tiene sobrepeso y al menos un factor de riesgo para la diabetes.  El cncer colorrectal puede detectarse y con frecuencia puede prevenirse. La mayor parte de los estudios de rutina se deben Medical laboratory scientific officer a Field seismologist a Proofreader de los 80 aos y [place] 67 aos. Sin embargo, el mdico podr aconsejarle que lo haga antes, si tiene factores de riesgo para el cncer de colon. Una vez por ao, el mdico le dar un kit de prueba para Hydrologist en la materia fecal. Es posible que se use una pequea cmara en el extremo de un tubo para examinar directamente el colon (sigmoidoscopa o colonoscopa) para Hydrographic surveyor formas tempranas de cncer colorrectal. Hable sobre esto con su mdico si tiene 23 aos, edad a la que [employer] estudios de [place]. El examen directo del colon debe repetirse cada cinco a 10 aos, hasta los 75 aos, excepto que se encuentren formas tempranas de plipos precancerosos o pequeos bultos.  Las personas con un riesgo mayor de Insurance risk surveyor hepatitis B deben realizarse anlisis para Futures trader virus. Se considera que tiene un alto riesgo de Museum/gallery curator hepatitis B si:  Naci en un pas donde la hepatitis B es frecuente. Pregntele a su mdico qu pases son considerados de Public affairs consultant.  Sus padres nacieron en un pas de alto riesgo y usted no recibi una vacuna que lo proteja contra la hepatitis B (vacuna contra la hepatitis B).  [place].  [place] agujas para  inyectarse drogas.  Vive o tiene sexo con alguien que tiene hepatitis B.  Es un hombre que tiene sexo con otros hombres.  Recibe tratamiento de hemodilisis.  Toma ciertos medicamentos para Nurse, mental health, trasplante de rganos y afecciones autoinmunes.  Se recomienda realizar un anlisis de sangre para Hydrographic surveyor hepatitis C a todas las personas nacidas entre 1945 y 1965, y a toda persona que tenga un riesgo de haber contrado esta enfermedad.  Los hombres sanos no deben hacerse anlisis de sangre para Hydrographic surveyor antgenos especficos prostticos (PSA) como parte de los estudios de rutina para Science writer. Pregntele a su mdico sobre las pruebas de deteccin de cncer de prstata.  La evaluacin del cncer de testculos no se recomienda en hombres adolescentes ni adultos que no tengan sntomas. La evaluacin incluye el autoexamen, el examen por parte del mdico y otras pruebas diagnsticas. Consulte con su mdico si tiene algn sntoma o preocupaciones acerca del cncer de testculos.  Practique el sexo seguro. Use condones y evite las prcticas sexuales riesgosas para disminuir el contagio de enfermedades de transmisin sexual (ETS).  Debe realizarse pruebas de deteccin de ETS, incluidas la gonorrea y la clamidia si:  Es sexualmente Rosealee Albee y es menor de [place].  Es mayor de 82aos y Investment banker, operational dice que est en riesgo de padecer esta infeccin.  La actividad sexual ha cambiado desde que le hicieron la ltima prueba de deteccin y tiene un riesgo mayor de Best boy clamidia o Radio broadcast assistant. Pregntele al mdico si usted tiene riesgo.  Si tiene riesgo de infectarse por el VIH, se recomienda tomar diariamente un medicamento recetado para evitar la infeccin. Esto se conoce como profilaxis previa a la exposicin. Se considera que est en riesgo si:  Es un hombre que tiene sexo con otros hombres.  Es heterosexual y es activo sexualmente con mltiples parejas.  Se inyecta drogas.  Es  sexualmente activo con una pareja que tiene VIH.  Consulte a su mdico para saber si tiene un alto riesgo de infectarse por el VIH. Si opta por comenzar la profilaxis previa a la exposicin, primero debe realizarse anlisis de deteccin del VIH. Luego, le harn anlisis cada 85mses mientras est tomando los medicamentos para la profilaxis previa a la exposicin.  Utilice pantalla solar. Aplique pantalla solar de mKerry Doryy repetida a lo largo del dTraining and development officer Resgurdese del sol cuando la sombra sea ms pequea que usted. Protjase usando mangas y pThe Service[employer] Company un sombrero de ala ancha y gafas para el sol todo el ao, siempre que se encuentre en el exterior.  Informe a su mdico si aparecen nuevos lunares o los que tiene se modifican, especialmente en forma y color. Tambin notifique al mdico si un lunar es ms grande que el tamao de una goma de lGames developer  Si tiene entre 638y 774aos y es o ha sido fumador, se recomienda un estudio con ecografa para dEnvironmental managerde aorta abdominal (AAA) y su eventual reparacin q[place]  [place](inmunizaciones). Document Released: 06/01/2008 Document Revised: 12/09/2013 [facility] PortlandPatient Information 2015 [place] This information is not intended to replace advice given to you by your health care provider. Make sure you discuss any questions you have with your health care provider.

## 2015-07-30 LAB — HIV ANTIBODY (ROUTINE TESTING W REFLEX): HIV 1&2 Ab, 4th Generation: NONREACTIVE

## 2015-07-31 LAB — RPR

## 2015-08-02 ENCOUNTER — Encounter: Payer: Self-pay | Admitting: *Deleted

## 2015-08-03 ENCOUNTER — Telehealth: Payer: Self-pay | Admitting: Family Medicine

## 2015-08-03 DIAGNOSIS — Z0279 Encounter for issue of other medical certificate: Secondary | ICD-10-CM

## 2015-08-03 NOTE — Telephone Encounter (Signed)
Pt dropped off form from work that needs filled out. Placing on Kims desk, call opt when ready to pick up thanks

## 2015-08-04 NOTE — Telephone Encounter (Signed)
Message left advising patient that form required nicotine test which was not done as routine labs when he was in for his CPE. Advised to call and schedule lab appt. Lab ordered.

## 2015-08-13 ENCOUNTER — Other Ambulatory Visit (INDEPENDENT_AMBULATORY_CARE_PROVIDER_SITE_OTHER): Payer: BLUE CROSS/BLUE SHIELD

## 2015-08-13 ENCOUNTER — Ambulatory Visit
Admission: RE | Admit: 2015-08-13 | Payer: BLUE CROSS/BLUE SHIELD | Source: Ambulatory Visit | Admitting: Gastroenterology

## 2015-08-13 ENCOUNTER — Encounter: Admission: RE | Payer: Self-pay | Source: Ambulatory Visit

## 2015-08-13 DIAGNOSIS — Z0279 Encounter for issue of other medical certificate: Secondary | ICD-10-CM

## 2015-08-13 SURGERY — COLONOSCOPY WITH PROPOFOL
Anesthesia: General

## 2015-08-17 LAB — NICOTINE/COTININE METABOLITES: Cotinine: <10 ng/mL

## 2015-08-19 ENCOUNTER — Telehealth: Payer: Self-pay | Admitting: Family Medicine

## 2015-08-19 NOTE — Telephone Encounter (Signed)
Form in your IN box for completion. 

## 2015-08-19 NOTE — Telephone Encounter (Signed)
Pt dropped off a vitality form for work that needs to be filled out. Best number to contact pt is [phone]. Placing ppw on Kim's desk

## 2015-08-19 NOTE — Telephone Encounter (Signed)
Filled and in Kim's box. 

## 2015-08-20 NOTE — Telephone Encounter (Signed)
Lm on pts vm informing him paperwork is available at the front desk for pickup

## 2016-01-12 ENCOUNTER — Encounter: Payer: Self-pay | Admitting: Family Medicine

## 2016-01-12 ENCOUNTER — Ambulatory Visit (INDEPENDENT_AMBULATORY_CARE_PROVIDER_SITE_OTHER): Payer: BLUE CROSS/BLUE SHIELD | Admitting: Family Medicine

## 2016-01-12 VITALS — BP 124/78 | HR 84 | Temp 98.4°F | Wt 161.2 lb

## 2016-01-12 DIAGNOSIS — F4329 Adjustment disorder with other symptoms: Secondary | ICD-10-CM

## 2016-01-12 DIAGNOSIS — F4320 Adjustment disorder, unspecified: Secondary | ICD-10-CM | POA: Insufficient documentation

## 2016-01-12 DIAGNOSIS — G4700 Insomnia, unspecified: Secondary | ICD-10-CM

## 2016-01-12 DIAGNOSIS — G44229 Chronic tension-type headache, not intractable: Secondary | ICD-10-CM | POA: Diagnosis not present

## 2016-01-12 DIAGNOSIS — H02839 Dermatochalasis of unspecified eye, unspecified eyelid: Secondary | ICD-10-CM

## 2016-01-12 DIAGNOSIS — H0230 Blepharochalasis unspecified eye, unspecified eyelid: Secondary | ICD-10-CM

## 2016-01-12 MED ORDER — TRAZODONE HCL 50 MG PO TABS: 25.0000 mg | ORAL_TABLET | Freq: Every evening | ORAL | Status: DC | PRN

## 2016-01-12 MED ORDER — METHOCARBAMOL 500 MG PO TABS: 500.0000 mg | ORAL_TABLET | Freq: Three times a day (TID) | ORAL | Status: DC | PRN

## 2016-01-12 NOTE — Assessment & Plan Note (Signed)
See above - treat with trazodone trial.

## 2016-01-12 NOTE — Progress Notes (Signed)
BP 124/78 mmHg  Pulse 84  Temp(Src) 98.4 F (36.9 C) (Oral)  Wt 161 lb 4 oz (73.143 kg)   CC: trouble sleeping, L eyelid swelling  Subjective:    Patient ID: Shane Combs, male    DOB: Jul 16, 1966, 50 y.o.   MRN: [id number]  HPI: Shane Combs is a 50 y.o. male presenting on 01/12/2016 for Insomnia and Eye Problem   3 mo h/o sleep initiation and maintenance insomnia. Wakes up feeling heart palpitations without arrhythmia. No nightmares. Intermittent headaches and muscle tightness in posterior neck. Has tried melatonin 10mg  and zzzquil without improvement. + snoring.   Reviewed bedtime routine. Bedtime is 9pm. Has calm, quiet environment. No stimulaiing activities at bedtime.   Eyebrows feel heavy for last 3 months as well. Feels has excess skin weighing eyes down. No vision changes. No redness of eyes or conjunctiva or pain. Recently saw optometrist with prescription for new reading glasses.   Wife left him 3-4 months ago. Insomnia sxs started after this. Endorses initially more depression/anxiety but currently doing well.   Relevant past medical, surgical, family and social history reviewed and updated as indicated. Interim medical history since our last visit reviewed. Allergies and medications reviewed and updated. No current outpatient prescriptions on file prior to visit.   No current facility-administered medications on file prior to visit.    Review of Systems Per HPI unless specifically indicated in ROS section     Objective:    BP 124/78 mmHg  Pulse 84  Temp(Src) 98.4 F (36.9 C) (Oral)  Wt 161 lb 4 oz (73.143 kg)  Wt Readings from Last 3 Encounters:  01/12/16 161 lb 4 oz (73.143 kg)  07/29/15 170 lb (77.111 kg)  06/01/15 170 lb (77.111 kg)    Physical Exam  Constitutional: He appears well-developed and well-nourished. No distress.  HENT:  Mouth/Throat: Oropharynx is clear and moist. No oropharyngeal exudate.  Eyes: Conjunctivae and EOM are normal. Pupils are  equal, round, and reactive to light.  Neck: Normal range of motion. Neck supple. No thyromegaly present.  Cardiovascular: Normal rate, regular rhythm, normal heart sounds and intact distal pulses.   No murmur heard. Pulmonary/Chest: Effort normal and breath sounds normal. No respiratory distress. He has no wheezes. He has no rales.  Musculoskeletal: He exhibits no edema.  Lymphadenopathy:    He has no cervical adenopathy.  Skin: Skin is warm and dry. No rash noted.  Psychiatric: He has a normal mood and affect. His behavior is normal. Thought content normal.  Nursing note and vitals reviewed.     Assessment & Plan:  Over 25 minutes were spent face-to-face with the patient during this encounter and >50% of that time was spent on counseling and coordination of care  Problem List Items Addressed This Visit    TTH (tension-type headache)    Trial robaxin.      Relevant Medications   traZODone (DESYREL) 50 MG tablet   methocarbamol (ROBAXIN) 500 MG tablet   Insomnia    See above - treat with trazodone trial.      Eyelid excess skin    With heaviness - anticipate due to insomnia. rec improve sleep then reassess. Discussed ophtho referral.      Adjustment disorder - Primary    Reviewed recent stressful family situation. Support provided. Children supportive. Offered counseling. Discussed importance of local support network. Pt will start with re immersing himself into church environment, discussed possible pastoral counseling. Trial trazodone for sleep.  Follow up plan: Return in about 4 weeks (around 02/09/2016), or if symptoms worsen or fail to improve, for follow up visit.

## 2016-01-12 NOTE — Assessment & Plan Note (Signed)
With heaviness - anticipate due to insomnia. rec improve sleep then reassess. Discussed ophtho referral.

## 2016-01-12 NOTE — Patient Instructions (Addendum)
Empezemos trazodone 25-50mg  cada noche para dormir Puede tratar robaxin relajante de musculo para cuello Regrese en 1 mes para follow up Hang in there!

## 2016-01-12 NOTE — Assessment & Plan Note (Signed)
Trial robaxin.

## 2016-01-12 NOTE — Progress Notes (Signed)
Pre visit review using our clinic review tool, if applicable. No additional management support is needed unless otherwise documented below in the visit note. 

## 2016-01-12 NOTE — Assessment & Plan Note (Signed)
Reviewed recent stressful family situation. Support provided. Children supportive. Offered counseling. Discussed importance of local support network. Pt will start with re immersing himself into church environment, discussed possible pastoral counseling. Trial trazodone for sleep.

## 2016-02-14 ENCOUNTER — Telehealth: Payer: Self-pay | Admitting: Family Medicine

## 2016-02-14 ENCOUNTER — Ambulatory Visit: Payer: BLUE CROSS/BLUE SHIELD | Admitting: Family Medicine

## 2016-02-14 NOTE — Telephone Encounter (Signed)
Patient did not come for their scheduled appointment today for 1 month follow up Please let me know if the patient needs to be contacted immediately for follow up or if no follow up is necessary.   ° °

## 2016-02-15 NOTE — Telephone Encounter (Signed)
Would touch base with patient to see how trazodone is helping, offer f/u in office.

## 2016-02-16 NOTE — Telephone Encounter (Signed)
Message left for patient to return my call.  

## 2016-02-22 NOTE — Telephone Encounter (Signed)
Message left for patient to return my call with update on meds and to schedule follow up in needed.

## 2016-02-24 NOTE — Telephone Encounter (Signed)
Spoke with patient. Trazodone is helping, but he can only take 1/2 tablet. I told him that was fine. Follow up scheduled.

## 2016-02-24 NOTE — Telephone Encounter (Signed)
Patient returned patient's call. Please call back at [phone].

## 2016-03-30 ENCOUNTER — Ambulatory Visit: Payer: BLUE CROSS/BLUE SHIELD | Admitting: Family Medicine

## 2016-03-30 DIAGNOSIS — Z0289 Encounter for other administrative examinations: Secondary | ICD-10-CM

## 2016-05-02 ENCOUNTER — Inpatient Hospital Stay: Payer: BLUE CROSS/BLUE SHIELD | Admitting: Family Medicine

## 2016-05-02 ENCOUNTER — Inpatient Hospital Stay: Payer: BLUE CROSS/BLUE SHIELD

## 2016-05-02 ENCOUNTER — Other Ambulatory Visit: Payer: Self-pay | Admitting: *Deleted

## 2016-05-02 DIAGNOSIS — C499 Malignant neoplasm of connective and soft tissue, unspecified: Secondary | ICD-10-CM

## 2016-05-16 ENCOUNTER — Inpatient Hospital Stay: Payer: BLUE CROSS/BLUE SHIELD | Admitting: Family Medicine

## 2016-05-16 ENCOUNTER — Inpatient Hospital Stay: Payer: BLUE CROSS/BLUE SHIELD

## 2016-05-30 ENCOUNTER — Inpatient Hospital Stay: Payer: BLUE CROSS/BLUE SHIELD

## 2016-05-30 ENCOUNTER — Inpatient Hospital Stay: Payer: BLUE CROSS/BLUE SHIELD | Admitting: Family Medicine

## 2016-05-30 ENCOUNTER — Inpatient Hospital Stay: Payer: BLUE CROSS/BLUE SHIELD | Attending: Family Medicine | Admitting: Family Medicine

## 2016-05-30 DIAGNOSIS — Z79899 Other long term (current) drug therapy: Secondary | ICD-10-CM | POA: Insufficient documentation

## 2016-05-30 DIAGNOSIS — Z923 Personal history of irradiation: Secondary | ICD-10-CM | POA: Insufficient documentation

## 2016-05-30 DIAGNOSIS — K219 Gastro-esophageal reflux disease without esophagitis: Secondary | ICD-10-CM | POA: Insufficient documentation

## 2016-05-30 DIAGNOSIS — C499 Malignant neoplasm of connective and soft tissue, unspecified: Secondary | ICD-10-CM

## 2016-05-30 DIAGNOSIS — G4700 Insomnia, unspecified: Secondary | ICD-10-CM | POA: Insufficient documentation

## 2016-05-30 DIAGNOSIS — Z87442 Personal history of urinary calculi: Secondary | ICD-10-CM | POA: Insufficient documentation

## 2016-05-30 DIAGNOSIS — Z8589 Personal history of malignant neoplasm of other organs and systems: Secondary | ICD-10-CM | POA: Insufficient documentation

## 2016-05-30 LAB — COMPREHENSIVE METABOLIC PANEL
ALK PHOS: 76 U/L (ref 38–126)
ALT: 44 U/L (ref 17–63)
ANION GAP: 5 (ref 5–15)
AST: 29 U/L (ref 15–41)
Albumin: 3.9 g/dL (ref 3.5–5.0)
BILIRUBIN TOTAL: 0.9 mg/dL (ref 0.3–1.2)
BUN: 17 mg/dL (ref 6–20)
CALCIUM: 9.2 mg/dL (ref 8.9–10.3)
CO2: 27 mmol/L (ref 22–32)
Chloride: 104 mmol/L (ref 101–111)
Creatinine, Ser: 0.87 mg/dL (ref 0.61–1.24)
GFR calc non Af Amer: >60 mL/min (ref >60)
Glucose, Bld: 108 mg/dL — ABNORMAL HIGH (ref 65–99)
POTASSIUM: 3.9 mmol/L (ref 3.5–5.1)
Sodium: 136 mmol/L (ref 135–145)
TOTAL PROTEIN: 6.7 g/dL (ref 6.5–8.1)

## 2016-05-30 LAB — CBC WITH DIFFERENTIAL/PLATELET
Basophils Absolute: 0 10*3/uL (ref 0–0.1)
Basophils Relative: 0 %
EOS ABS: 0 10*3/uL (ref 0–0.7)
Eosinophils Relative: 1 %
HEMATOCRIT: 45.5 % (ref 40.0–52.0)
HEMOGLOBIN: 15.9 g/dL (ref 13.0–18.0)
LYMPHS ABS: 2 10*3/uL (ref 1.0–3.6)
Lymphocytes Relative: 32 %
MCH: 31.2 pg (ref 26.0–34.0)
MCHC: 34.9 g/dL (ref 32.0–36.0)
MCV: 89.4 fL (ref 80.0–100.0)
MONO ABS: 0.5 10*3/uL (ref 0.2–1.0)
MONOS PCT: 8 %
NEUTROS ABS: 3.7 10*3/uL (ref 1.4–6.5)
NEUTROS PCT: 59 %
Platelets: 216 10*3/uL (ref 150–440)
RBC: 5.09 MIL/uL (ref 4.40–5.90)
RDW: 13.6 % (ref 11.5–14.5)
WBC: 6.2 10*3/uL (ref 3.8–10.6)

## 2016-05-30 NOTE — Progress Notes (Signed)
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 Patient just recently reestablished oncology care after being lost to clinic for 4+ years. He has no complaints or any significant problems at this time. He was originally diagnosed in [place] and is status post resection with radiation therapy. This clinic has been unable to establish the records therefore exact staging is not known. Patient is having insomnia since November 2016 due to separation. Otherwise patient feels very well and denies any acute complaints. He continues to follow very closely with Dr. Ria Bush.  REVIEW OF SYSTEMS:   Review of Systems  Constitutional: Negative for fever, chills, weight loss, malaise/fatigue and diaphoresis.  HENT: Negative.   Eyes: Negative.   Respiratory: Negative for cough, hemoptysis, sputum production, shortness of breath and wheezing.   Cardiovascular: Negative for chest pain, palpitations, orthopnea, claudication, leg swelling and PND.  Gastrointestinal: Negative for heartburn, nausea, vomiting, abdominal pain, diarrhea, constipation, blood in stool and melena.  Genitourinary: Negative.   Musculoskeletal: Negative.   Skin: Negative.   Neurological: Negative for dizziness, tingling, focal weakness, seizures and weakness.  Endo/Heme/Allergies: Does not bruise/bleed easily.  Psychiatric/Behavioral: Negative for depression. The patient has insomnia. The patient is not nervous/anxious.     As per HPI. Otherwise, a complete review of systems is negatve.   PAST MEDICAL HISTORY: Past Medical History  Diagnosis Date  . Tension type headache   . GERD  (gastroesophageal reflux disease)   . History of kidney stones 2013  . Liposarcoma ([place]) 2000    s/p colon resection - [place]    PAST SURGICAL HISTORY: Past Surgical History  Procedure Laterality Date  . Colon surgery  2014    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[place], saw oncologist x 6 yrs  . Wrist surgery Right 1990s  . Knee arthroscopy Left 1990s    meniscal repair    FAMILY HISTORY Family History  Problem Relation Age of Onset  . Cancer Neg Hx   . Hypertension Neg Hx   . Diabetes Neg Hx   . CAD Neg Hx   . Stroke Neg Hx     GYNECOLOGIC HISTORY:  No LMP for male patient.     ADVANCED DIRECTIVES:    HEALTH MAINTENANCE: Social History  Substance Use Topics  . Smoking status: Never Smoker   . Smokeless tobacco: Never Used  . Alcohol Use: No     No Known Allergies  Current Outpatient Prescriptions  Medication Sig Dispense Refill  . methocarbamol (ROBAXIN) 500 MG tablet Take 1 tablet (500 mg total) by mouth 3 (three) times daily as needed for muscle spasms (sedation precautions). 30 tablet 0  . traZODone (DESYREL) 50 MG tablet Take 0.5-1 tablets (25-50 mg total) by mouth at bedtime as needed for sleep. 30 tablet 3   No current facility-administered medications for this visit.    OBJECTIVE: There were no vitals taken for this visit.   There is no weight on file to calculate BMI.    ECOG FS:0 - Asymptomatic  General: Well-developed, well-nourished, no acute distress. Eyes: Pink conjunctiva, anicteric sclera. HEENT: Normocephalic, moist mucous membranes, clear oropharnyx. Lungs: Clear to auscultation bilaterally. Heart: Regular rate and rhythm. No rubs, murmurs, or gallops. Abdomen:  Soft, nontender, nondistended. No organomegaly noted, normoactive bowel sounds. Musculoskeletal: No edema, cyanosis, or clubbing. Neuro: Alert, answering all questions appropriately. Cranial nerves grossly intact. Skin: No rashes or petechiae noted. Psych: Normal affect. Lymphatics: No cervical,  clavicular LAD.   LAB RESULTS:  Appointment on 05/30/2016  Component Date Value Ref Range Status  . WBC 05/30/2016 6.2  3.8 - 10.6 K/uL Final  . RBC 05/30/2016 5.09  4.40 - 5.90 MIL/uL Final  . Hemoglobin 05/30/2016 15.9  13.0 - 18.0 g/dL Final  . HCT 05/30/2016 45.5  40.0 - 52.0 % Final  . MCV 05/30/2016 89.4  80.0 - 100.0 fL Final  . MCH 05/30/2016 31.2  26.0 - 34.0 pg Final  . MCHC 05/30/2016 34.9  32.0 - 36.0 g/dL Final  . RDW 05/30/2016 13.6  11.5 - 14.5 % Final  . Platelets 05/30/2016 216  150 - 440 K/uL Final  . Neutrophils Relative % 05/30/2016 59   Final  . Neutro Abs 05/30/2016 3.7  1.4 - 6.5 K/uL Final  . Lymphocytes Relative 05/30/2016 32   Final  . Lymphs Abs 05/30/2016 2.0  1.0 - 3.6 K/uL Final  . Monocytes Relative 05/30/2016 8   Final  . Monocytes Absolute 05/30/2016 0.5  0.2 - 1.0 K/uL Final  . Eosinophils Relative 05/30/2016 1   Final  . Eosinophils Absolute 05/30/2016 0.0  0 - 0.7 K/uL Final  . Basophils Relative 05/30/2016 0   Final  . Basophils Absolute 05/30/2016 0.0  0 - 0.1 K/uL Final  . Sodium 05/30/2016 136  135 - 145 mmol/L Final  . Potassium 05/30/2016 3.9  3.5 - 5.1 mmol/L Final  . Chloride 05/30/2016 104  101 - 111 mmol/L Final  . CO2 05/30/2016 27  22 - 32 mmol/L Final  . Glucose, Bld 05/30/2016 108* 65 - 99 mg/dL Final  . BUN 05/30/2016 17  6 - 20 mg/dL Final  . Creatinine, Ser 05/30/2016 0.87  0.61 - 1.24 mg/dL Final  . Calcium 05/30/2016 9.2  8.9 - 10.3 mg/dL Final  . Total Protein 05/30/2016 6.7  6.5 - 8.1 g/dL Final  . Albumin 05/30/2016 3.9  3.5 - 5.0 g/dL Final  . AST 05/30/2016 29  15 - 41 U/L Final  . ALT 05/30/2016 44  17 - 63 U/L Final  . Alkaline Phosphatase 05/30/2016 76  38 - 126 U/L Final  . Total Bilirubin 05/30/2016 0.9  0.3 - 1.2 mg/dL Final  . GFR calc non Af Amer 05/30/2016 >60  >60 mL/min Final  . GFR calc Af Amer 05/30/2016 >60  >60 mL/min Final   Comment: (NOTE) The eGFR has been calculated using the CKD EPI  equation. This calculation has not been validated in all clinical situations. eGFR's persistently <60 mL/min signify possible Chronic Kidney Disease.   . Anion gap 05/30/2016 5  5 - 15 Final    STUDIES: No results found.  ASSESSMENT:  Liposarcoma of the abdomen, staging unknown due to being diagnosed and treated in [place].   PLAN:  1. Liposarcoma. Patient was originally diagnosed in [place] in 2000 and is status post surgery as well as radiation therapy. There is clinically no evidence of recurrent disease and he is not having any new complaints or issues. Advised patient that he did not require continued follow-up in this office. He states that he would like to continue with close routine follow-up with Dr. Ria Bush and if needed he can be referred back to our clinic for any further issues.  Patient expressed understanding and was in agreement with this plan. He also understands that He can call clinic at any time with any questions, concerns, or complaints.   Dr. Grayland Ormond was available for consultation and review of plan of care for this patient.   Evlyn Kanner, NP   05/30/2016 2:59 PM

## 2016-05-31 ENCOUNTER — Telehealth: Payer: Self-pay

## 2016-05-31 MED ORDER — TEMAZEPAM 7.5 MG PO CAPS: 7.5000 mg | ORAL_CAPSULE | Freq: Every evening | ORAL | Status: DC | PRN

## 2016-05-31 NOTE — Telephone Encounter (Signed)
Pt left triage note at front desk and it is in [place]. In Dr Synthia Innocent in box.

## 2016-05-31 NOTE — Telephone Encounter (Signed)
Initially trazodone effective for sleep but now endorses not helping at all.  Has also failed zzquil and melatonin. May do temporary restoril trial and recommend f/u in office over next month - advise to use 1-2 capsules as needed for sleep, try not to use regularly as it can be habit forming.

## 2016-06-01 NOTE — Telephone Encounter (Signed)
Rx called in as directed and message left advising patient to call and schedule f/u and to only use med PRN.

## 2017-01-02 IMAGING — CR DG CHEST 2V
1 series · 2 of 2 positions shown · non-contrast
Comparison: None.

CLINICAL DATA: Three weeks of productive cough and chest
congestion, nonsmoker.

EXAM:
CHEST  2 VIEW

[Series 1: dg chest 2 view · 0.14mm/px · 2 of 2 slices shown]
[im 1/2]
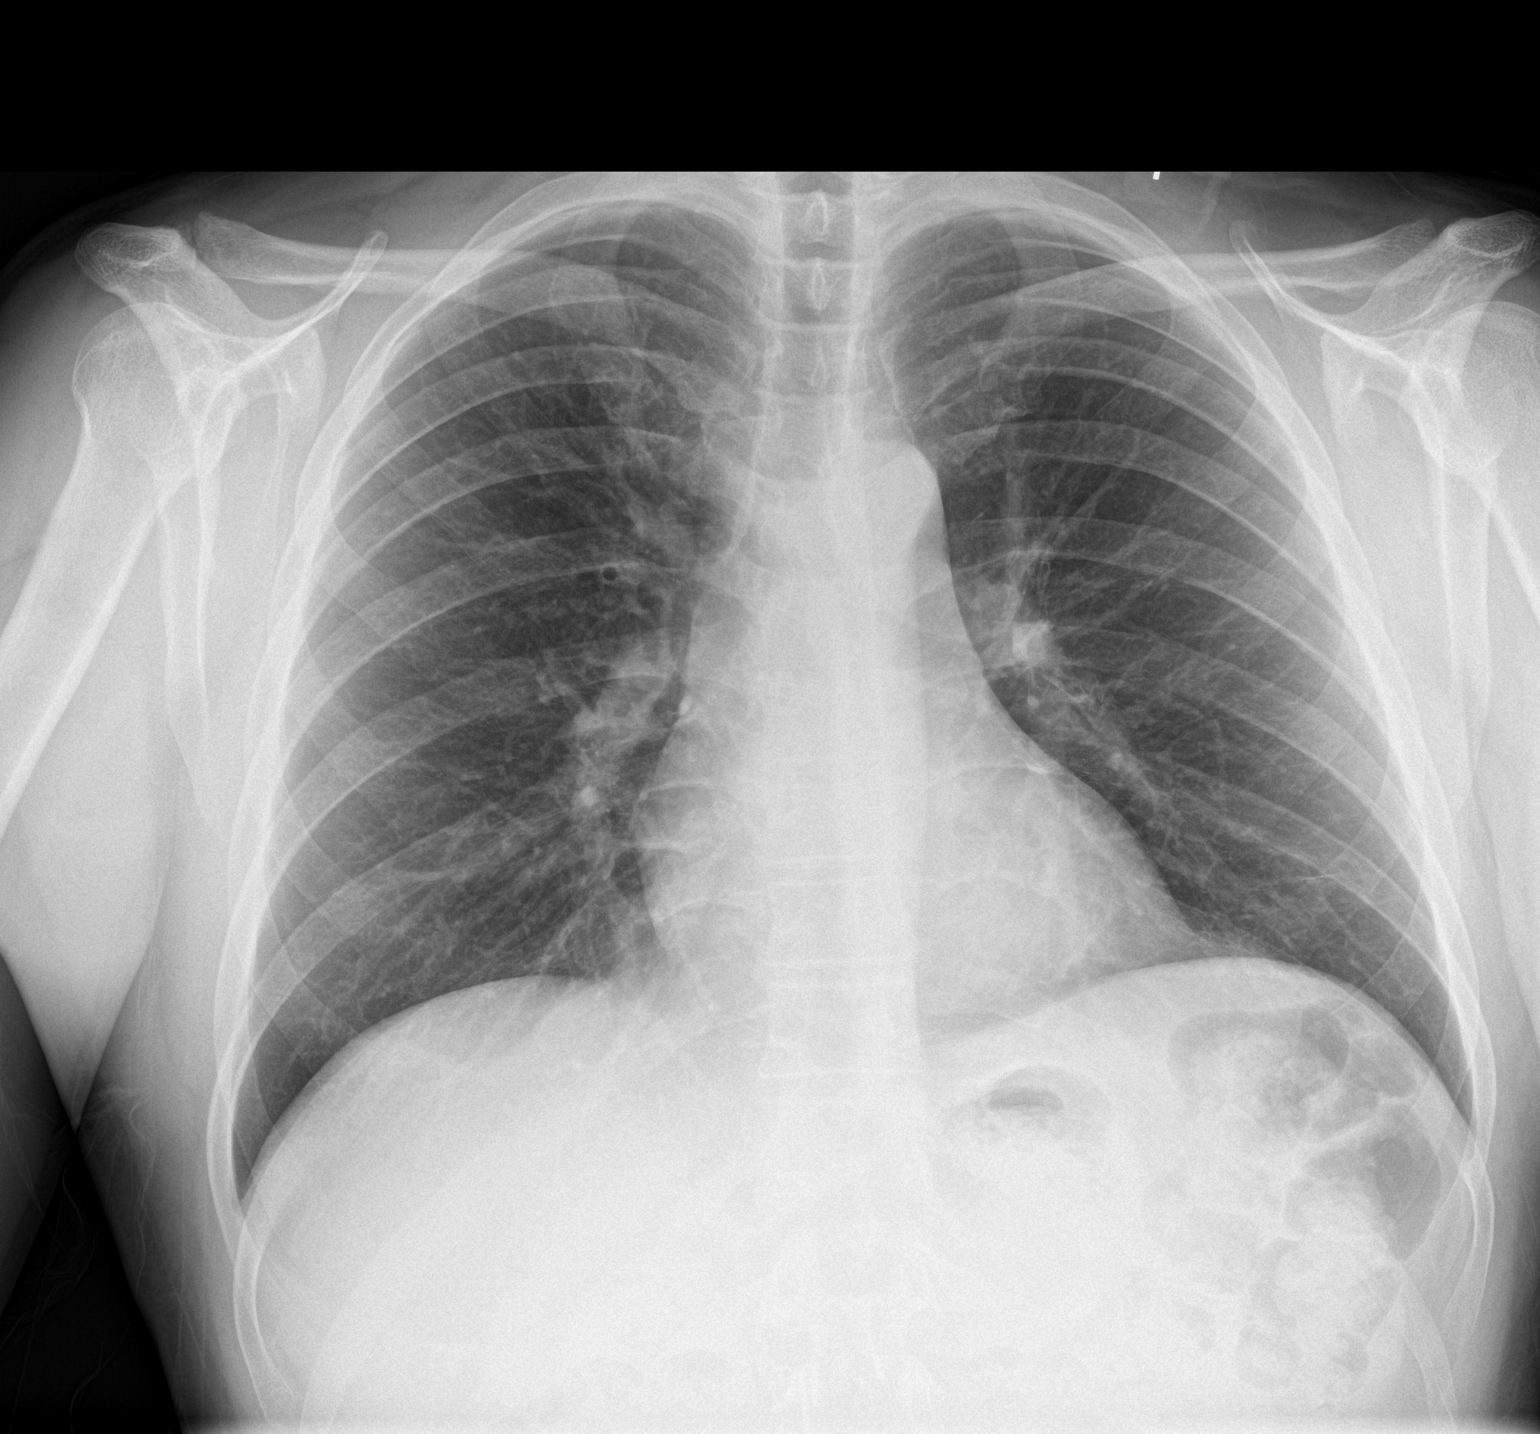
[im 2/2]
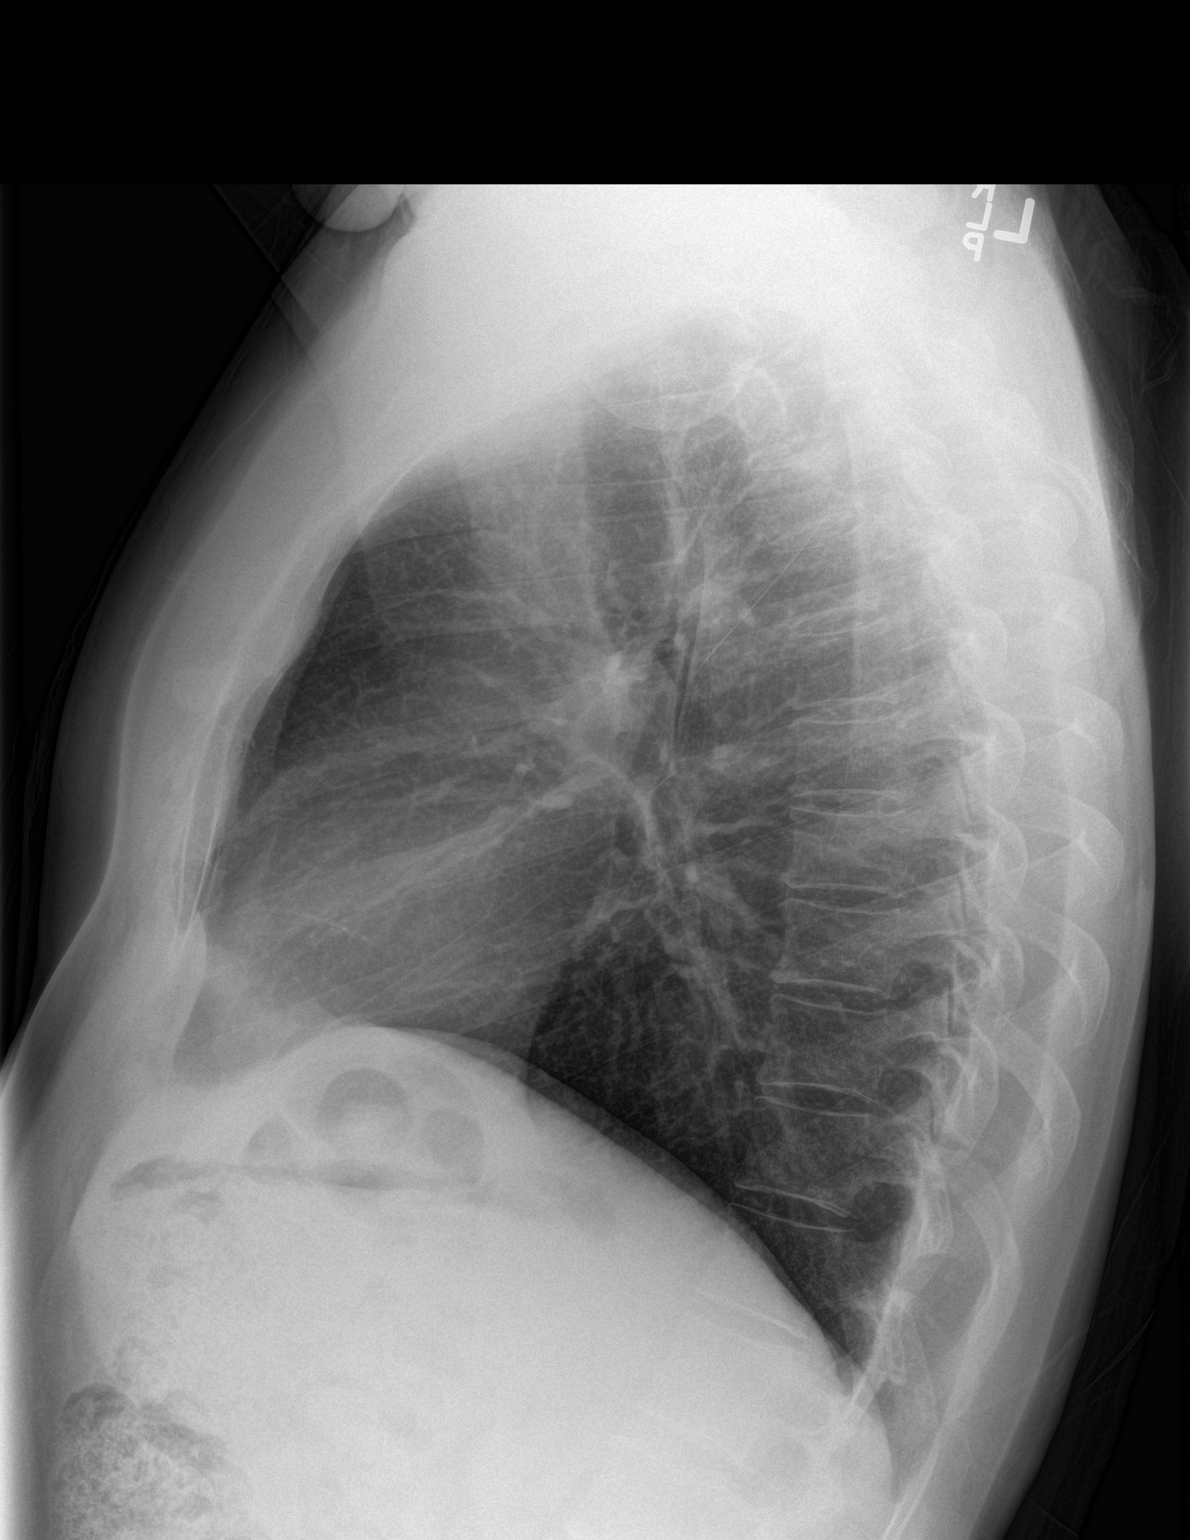

[2 of 2 positions shown; findings below may reference images not displayed]

FINDINGS: The lungs are adequately inflated and clear. The heart and pulmonary
vascularity are normal. The mediastinum is normal in width. There is
no pleural effusion. The bony thorax is unremarkable.
IMPRESSION: There is no active cardiopulmonary disease.

## 2019-12-19 HISTORY — PX: COLONOSCOPY: SHX174

## 2020-10-18 HISTORY — PX: COLONOSCOPY: SHX174

## 2020-11-09 LAB — HM COLONOSCOPY

## 2021-03-25 DIAGNOSIS — M25511 Pain in right shoulder: Secondary | ICD-10-CM | POA: Diagnosis not present

## 2021-03-25 DIAGNOSIS — Z131 Encounter for screening for diabetes mellitus: Secondary | ICD-10-CM | POA: Diagnosis not present

## 2021-03-25 DIAGNOSIS — Z113 Encounter for screening for infections with a predominantly sexual mode of transmission: Secondary | ICD-10-CM | POA: Diagnosis not present

## 2021-03-25 DIAGNOSIS — Z136 Encounter for screening for cardiovascular disorders: Secondary | ICD-10-CM | POA: Diagnosis not present

## 2021-03-25 DIAGNOSIS — Z13220 Encounter for screening for lipoid disorders: Secondary | ICD-10-CM | POA: Diagnosis not present

## 2021-03-25 DIAGNOSIS — Z13228 Encounter for screening for other metabolic disorders: Secondary | ICD-10-CM | POA: Diagnosis not present

## 2021-03-25 DIAGNOSIS — R030 Elevated blood-pressure reading, without diagnosis of hypertension: Secondary | ICD-10-CM | POA: Diagnosis not present

## 2021-03-31 DIAGNOSIS — E7849 Other hyperlipidemia: Secondary | ICD-10-CM | POA: Diagnosis not present

## 2021-03-31 DIAGNOSIS — H1045 Other chronic allergic conjunctivitis: Secondary | ICD-10-CM | POA: Diagnosis not present

## 2021-03-31 DIAGNOSIS — J301 Allergic rhinitis due to pollen: Secondary | ICD-10-CM | POA: Diagnosis not present

## 2021-03-31 DIAGNOSIS — R030 Elevated blood-pressure reading, without diagnosis of hypertension: Secondary | ICD-10-CM | POA: Diagnosis not present

## 2021-03-31 DIAGNOSIS — Z113 Encounter for screening for infections with a predominantly sexual mode of transmission: Secondary | ICD-10-CM | POA: Diagnosis not present

## 2021-04-08 DIAGNOSIS — R9431 Abnormal electrocardiogram [ECG] [EKG]: Secondary | ICD-10-CM | POA: Diagnosis not present

## 2021-04-08 DIAGNOSIS — R55 Syncope and collapse: Secondary | ICD-10-CM | POA: Diagnosis not present

## 2021-04-08 DIAGNOSIS — K648 Other hemorrhoids: Secondary | ICD-10-CM | POA: Diagnosis not present

## 2021-04-08 DIAGNOSIS — R5383 Other fatigue: Secondary | ICD-10-CM | POA: Diagnosis not present

## 2021-04-28 DIAGNOSIS — R072 Precordial pain: Secondary | ICD-10-CM | POA: Diagnosis not present

## 2021-04-28 DIAGNOSIS — R9431 Abnormal electrocardiogram [ECG] [EKG]: Secondary | ICD-10-CM | POA: Diagnosis not present

## 2021-04-28 DIAGNOSIS — E785 Hyperlipidemia, unspecified: Secondary | ICD-10-CM | POA: Diagnosis not present

## 2021-05-19 DIAGNOSIS — Z7184 Encounter for health counseling related to travel: Secondary | ICD-10-CM | POA: Diagnosis not present

## 2021-05-23 DIAGNOSIS — Z7184 Encounter for health counseling related to travel: Secondary | ICD-10-CM | POA: Diagnosis not present

## 2022-10-17 ENCOUNTER — Telehealth: Payer: Self-pay | Admitting: Family Medicine

## 2022-10-17 NOTE — Telephone Encounter (Signed)
Ok to schedule.

## 2022-10-17 NOTE — Telephone Encounter (Signed)
Pt came in office requesting to reestablish care with PCP . Please advise [phone]

## 2022-10-18 NOTE — Telephone Encounter (Signed)
Patient has been scheduled

## 2022-10-27 ENCOUNTER — Encounter: Payer: Self-pay | Admitting: Family Medicine

## 2022-10-27 ENCOUNTER — Ambulatory Visit (INDEPENDENT_AMBULATORY_CARE_PROVIDER_SITE_OTHER): Payer: BC Managed Care – PPO | Admitting: Family Medicine

## 2022-10-27 ENCOUNTER — Telehealth: Payer: Self-pay | Admitting: Family Medicine

## 2022-10-27 VITALS — BP 134/78 | HR 87 | Temp 97.3°F | Ht 66.75 in | Wt 182.6 lb

## 2022-10-27 DIAGNOSIS — R0789 Other chest pain: Secondary | ICD-10-CM

## 2022-10-27 DIAGNOSIS — R3129 Other microscopic hematuria: Secondary | ICD-10-CM

## 2022-10-27 DIAGNOSIS — K625 Hemorrhage of anus and rectum: Secondary | ICD-10-CM | POA: Diagnosis not present

## 2022-10-27 DIAGNOSIS — Z859 Personal history of malignant neoplasm, unspecified: Secondary | ICD-10-CM

## 2022-10-27 DIAGNOSIS — G4700 Insomnia, unspecified: Secondary | ICD-10-CM | POA: Diagnosis not present

## 2022-10-27 DIAGNOSIS — R0683 Snoring: Secondary | ICD-10-CM

## 2022-10-27 DIAGNOSIS — E785 Hyperlipidemia, unspecified: Secondary | ICD-10-CM

## 2022-10-27 DIAGNOSIS — R030 Elevated blood-pressure reading, without diagnosis of hypertension: Secondary | ICD-10-CM

## 2022-10-27 DIAGNOSIS — N529 Male erectile dysfunction, unspecified: Secondary | ICD-10-CM | POA: Diagnosis not present

## 2022-10-27 MED ORDER — TADALAFIL 10 MG PO TABS: 10.0000 mg | ORAL_TABLET | ORAL | 1 refills | Status: DC | PRN

## 2022-10-27 MED ORDER — HYDROCORTISONE ACETATE 25 MG RE SUPP: 25.0000 mg | Freq: Two times a day (BID) | RECTAL | 0 refills | Status: DC

## 2022-10-27 MED ORDER — FISH OIL 1000 MG PO CAPS: 1.0000 | ORAL_CAPSULE | Freq: Two times a day (BID) | ORAL | 0 refills | Status: DC

## 2022-10-27 NOTE — Telephone Encounter (Signed)
Pt came in office to drop off paper work from [employer] for PCP review it was place in PCP folder

## 2022-10-27 NOTE — Progress Notes (Unsigned)
Patient ID: Shane Combs, male    DOB: Oct 21, 1966, 56 y.o.   MRN: [id number]  This visit was conducted in person.  BP 134/78   Pulse 87   Temp (!) 97.3 F (36.3 C) (Temporal)   Ht 5' 6.75" (1.695 m)   Wt 182 lb 9.6 oz (82.8 kg)   SpO2 96%   BMI 28.81 kg/m    CC: re establish care  Subjective:   HPI: Shane Combs is a 56 y.o. male presenting on 10/27/2022 for New Patient (Initial Visit)   Last seen 2017. Now lives in [place].   H/o liposarcoma dx [place] 2001 s/p surgery (R hemicolectomy) and radiation therapy. Last saw oncology 2017, no evidence of recurrent disease, rec f/u PRN,   Over the past year noticing blood with wiping and in commode. No pain. He does note bump with wiping. He tried suppositories without benefit.    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 He hasn't noted blood in urine.   Colon cancer screening - 08/13/2015 [facility] [place] Had colonoscopy ~2021 - states normal.   New 25yo wife. Notes some ED - trouble maintaining erection. Previously tried generic sildenafil - even '20mg'$  dose - caused malaise - felt body flushed with this.   Insomnia - started melatonin with significant improvement.  Loud snorer, non restorative sleep, no daytime somnolence, rare PNdyspnea.   He moved to [place] for 6 yrs - had high cholesterol as well as high blood pressure.  He was on BP medication but unsure which.  He's been taking fish oil.   Lives with wife (Shane Combs) and 2 daughters Shane Combs: HS  From [place]  Occ: Wellsite geologist  Activity: gym QOD  Diet: good water, fruits/vegetables daily     Relevant past medical, surgical, family and social history reviewed and updated as indicated. Interim medical history since our last visit reviewed. Allergies and medications reviewed and updated. Outpatient Medications Prior to Visit  Medication Sig Dispense Refill   methocarbamol (ROBAXIN) 500 MG tablet Take  1 tablet (500 mg total) by mouth 3 (three) times daily as needed for muscle spasms (sedation precautions). 30 tablet 0   temazepam (RESTORIL) 7.5 MG capsule Take 1-2 capsules (7.5-15 mg total) by mouth at bedtime as needed for sleep. 30 capsule 0   traZODone (DESYREL) 50 MG tablet Take 0.5-1 tablets (25-50 mg total) by mouth at bedtime as needed for sleep. 30 tablet 3   No facility-administered medications prior to visit.     Per HPI unless specifically indicated in ROS section below Review of Systems  Objective:  BP 134/78   Pulse 87   Temp (!) 97.3 F (36.3 C) (Temporal)   Ht 5' 6.75" (1.695 m)   Wt 182 lb 9.6 oz (82.8 kg)   SpO2 96%   BMI 28.81 kg/m   Wt Readings from Last 3 Encounters:  10/27/22 182 lb 9.6 oz (82.8 kg)  01/12/16 161 lb 4 oz (73.1 kg)  07/29/15 170 lb (77.1 kg)      Physical Exam    Results for orders placed or performed in visit on 05/30/16  CBC with Differential  Result Value Ref Range   WBC 6.2 3.8 - 10.6 K/uL   RBC 5.09 4.40 - 5.90 MIL/uL   Hemoglobin 15.9 13.0 - 18.0 g/dL   HCT 45.5 40.0 - 52.0 %   MCV 89.4 80.0 - 100.0 fL  MCH 31.2 26.0 - 34.0 pg   MCHC 34.9 32.0 - 36.0 g/dL   RDW 13.6 11.5 - 14.5 %   Platelets 216 150 - 440 K/uL   Neutrophils Relative % 59 %   Neutro Abs 3.7 1.4 - 6.5 K/uL   Lymphocytes Relative 32 %   Lymphs Abs 2.0 1.0 - 3.6 K/uL   Monocytes Relative 8 %   Monocytes Absolute 0.5 0.2 - 1.0 K/uL   Eosinophils Relative 1 %   Eosinophils Absolute 0.0 0 - 0.7 K/uL   Basophils Relative 0 %   Basophils Absolute 0.0 0 - 0.1 K/uL  Comprehensive metabolic panel  Result Value Ref Range   Sodium 136 135 - 145 mmol/L   Potassium 3.9 3.5 - 5.1 mmol/L   Chloride 104 101 - 111 mmol/L   CO2 27 22 - 32 mmol/L   Glucose, Bld 108 (H) 65 - 99 mg/dL   BUN 17 6 - 20 mg/dL   Creatinine, Ser 0.87 0.61 - 1.24 mg/dL   Calcium 9.2 8.9 - 10.3 mg/dL   Total Protein 6.7 6.5 - 8.1 g/dL   Albumin 3.9 3.5 - 5.0 g/dL   AST 29 15 - 41 U/L    ALT 44 17 - 63 U/L   Alkaline Phosphatase 76 38 - 126 U/L   Total Bilirubin 0.9 0.3 - 1.2 mg/dL   GFR calc non Af Amer >60 >60 mL/min   GFR calc Af Amer >60 >60 mL/min   Anion gap 5 5 - 15    Assessment & Plan:   Problem List Items Addressed This Visit   None    No orders of the defined types were placed in this encounter.  No orders of the defined types were placed in this encounter.    Patient instructions: [name] de su doctor en [facility]organs (primario y Brewing technologist) para pedir records.  Regresar [name] en ayunas para laboratorios. Haga cita adelante.  Puede usar supositorio que he mandado a Engineer, mining - si no mejora, dejeme saber para remision a Brewing technologist.  Trate cialis '10mg'$  1 vez cada otro dia cuando necesite.  Regresar en 2 meses para fisico EKG today  Follow up plan: No follow-ups on file.  Ria Bush, MD

## 2022-10-27 NOTE — Patient Instructions (Addendum)
Traigame contacto de su doctor en [facility] Morgans (primario y Brewing technologist) para pedir records.  Regresar [name] en ayunas para laboratorios. Haga cita adelante.  Puede usar supositorio que he mandado a Engineer, mining - si no mejora, dejeme saber para remision a Brewing technologist.  Trate cialis '10mg'$  1 vez cada otro dia cuando necesite.  Regresar en 2 meses para fisico EKG today  Hemorroides Hemorrhoids Las hemorroides son venas inflamadas adentro o alrededor del recto o del ano. Hay dos tipos de hemorroides: Hemorroides internas. Se forman en las venas del interior del recto. Pueden abultarse hacia afuera, irritarse y doler. Hemorroides externas. Se producen en las venas externas del ano y pueden sentirse como un bulto o zona hinchada, dura y dolorosa cerca del ano. [place] hemorroides no causan problemas graves y se Engineer, petroleum con tratamientos caseros [employer] cambios en la dieta y el estilo de vida. Si los tratamientos caseros no ayudan con los sntomas, se pueden Optometrist procedimientos para reducir o extirpar las hemorroides. Cules son las causas? La causa de esta afeccin es el aumento de la presin en la zona anal. Esta presin puede ser causada por distintos factores, por ejemplo: Estreimiento. Hacer un gran esfuerzo para defecar. Diarrea. Embarazo. Obesidad. Estar sentado durante largos perodos de [place]. [place] objetos pesados u otras actividades que impliquen esfuerzo. Sexo anal. Andar en bicicleta por un largo perodo de tiempo. Cules son los signos o los sntomas? Los sntomas de esta afeccin incluyen: Social research officer, government. Picazn o irritacin anal. Sangrado rectal. Prdida de materia fecal (heces). Inflamacin anal. Uno o ms bultos alrededor del ano. Cmo se diagnostica? Esta afeccin se diagnostica frecuentemente a travs de un examen visual. Posiblemente le realicen otros tipos de pruebas o estudios, como los siguientes: Un examen que implica palpar el rea  rectal con la mano enguantada (examen rectal digital). Un examen del canal anal que se realiza utilizando un pequeo tubo (anoscopio). Anlisis de sangre si ha perdido [place] cantidad significativa de [place]. Una prueba que consiste en la observacin del interior del colon utilizando un tubo flexible con una cmara en el extremo (sigmoidoscopia o colonoscopa). Cmo se trata? Esta afeccin generalmente se puede tratar en el hogar. Sin embargo, se pueden TEFL teacher procedimientos si los cambios en la dieta, en el estilo de vida y otros tratamientos caseros no [employer] sntomas. Estos procedimientos pueden ayudar a reducir o [place] hemorroides completamente. Algunos de estos procedimientos son quirrgicos y otros no. Algunos de los procedimientos ms frecuentes son los siguientes: Ligadura con Forensic psychologist. Las bandas elsticas se colocan en la base de las hemorroides para interrumpir su irrigacin de [place]. Escleroterapia. Se inyecta un medicamento en las hemorroides para reducir su tamao. Coagulacin con luz infrarroja. Se utiliza un tipo de energa lumnica para eliminar las hemorroides. Hemorroidectoma. Las hemorroides se extirpan con [place] y las venas que las [place] se IT consultant. Hemorroidopexia con grapas. El cirujano engrapa la base de las hemorroides a la pared del recto. Siga estas instrucciones en su casa: Comida y bebida  Consuma alimentos con alto contenido de [place], como cereales integrales, porotos, frutos secos, frutas y verduras. Pregntele a su mdico acerca de tomar productos con fibra aadida en ellos (complementos de fibra). Disminuya la cantidad de grasa de la dieta. Esto se puede lograr consumiendo productos lcteos con bajo contenido de grasas, ingiriendo menor cantidad de carnes rojas y evitando los alimentos procesados. Beba suficiente lquido como para Theatre manager la orina de color amarillo plido. Control del dolor y la hinchazn  Tome baos de asiento  tibios durante 20 minutos, 3 o 4 veces por da para Glass blower/designer y las [place]. Puede hacer esto en una baera o usar un dispositivo porttil para bao de asiento que se coloca sobre el inodoro. Si se lo indican, aplique hielo en la zona afectada. Usar compresas de [employer] baos de asiento puede ser [place]. Ponga el hielo en una bolsa plstica. Coloque una toalla entre la piel y Therapist, nutritional. Aplique el hielo durante 20 minutos, 2 o 3 veces por da. Instrucciones generales Use los medicamentos de venta libre y los recetados solamente como se lo haya indicado el mdico. Aplquese los medicamentos, cremas o supositorios como se lo hayan indicado. Haga ejercicio con regularidad. Consulte al mdico qu cantidad y qu tipo de ejercicio es mejor para usted. En general, debe realizar al menos 30 minutos de ejercicio moderado la [employer] de la semana (150 minutos cada semana). Esto puede incluir [employer], andar en bicicleta o practicar yoga. Vaya al bao cuando sienta la necesidad de defecar. No espere. Evite hacer fuerza en las deposiciones. Mantenga la zona anal limpia y seca. Use papel higinico hmedo o toallitas humedecidas despus de las deposiciones. No pase mucho tiempo sentado en el inodoro. Esto aumenta la afluencia de sangre y Conservation officer, historic buildings. Concurra a todas las visitas de seguimiento como se lo haya indicado el mdico. Esto es importante. Comunquese con un mdico si tiene: Aumento del dolor y la hinchazn que no puede controlar con medicamentos o Clinical research associate. No puede defecar o lo hace con dificultad. Dolor o tiene inflamacin fuera de la zona de las hemorroides. Solicite ayuda de inmediato si tiene: Hemorragia descontrolada en el recto. Resumen Las hemorroides son venas inflamadas adentro o alrededor del recto o del ano. La mayora de las hemorroides se pueden controlar con tratamientos caseros como cambios en la dieta y el estilo de vida. Tomar baos de  asiento con agua tibia puede ayudar a Best boy y las [place]. En los casos graves, se pueden realizar procedimientos o Ardelia Mems ciruga para reducir o [place] hemorroides. Esta informacin no tiene Marine scientist el consejo del mdico. Asegrese de hacerle al mdico cualquier pregunta que tenga. Document Revised: 07/12/2021 Document Reviewed: 07/12/2021 Elsevier Patient Education  [place].

## 2022-10-27 NOTE — Telephone Encounter (Signed)
Placed notes in Dr. Synthia Innocent box.

## 2022-10-28 ENCOUNTER — Encounter: Payer: Self-pay | Admitting: Family Medicine

## 2022-10-28 ENCOUNTER — Other Ambulatory Visit: Payer: Self-pay | Admitting: Family Medicine

## 2022-10-28 DIAGNOSIS — R030 Elevated blood-pressure reading, without diagnosis of hypertension: Secondary | ICD-10-CM | POA: Insufficient documentation

## 2022-10-28 DIAGNOSIS — R0683 Snoring: Secondary | ICD-10-CM | POA: Insufficient documentation

## 2022-10-28 DIAGNOSIS — N529 Male erectile dysfunction, unspecified: Secondary | ICD-10-CM | POA: Insufficient documentation

## 2022-10-28 DIAGNOSIS — K625 Hemorrhage of anus and rectum: Secondary | ICD-10-CM | POA: Insufficient documentation

## 2022-10-28 DIAGNOSIS — E785 Hyperlipidemia, unspecified: Secondary | ICD-10-CM | POA: Insufficient documentation

## 2022-10-28 DIAGNOSIS — I10 Essential (primary) hypertension: Secondary | ICD-10-CM | POA: Insufficient documentation

## 2022-10-28 DIAGNOSIS — R3129 Other microscopic hematuria: Secondary | ICD-10-CM | POA: Insufficient documentation

## 2022-10-28 DIAGNOSIS — R9431 Abnormal electrocardiogram [ECG] [EKG]: Secondary | ICD-10-CM

## 2022-10-28 NOTE — Assessment & Plan Note (Signed)
Consider evaluation for sleep apnea. Denies significant daytime somnolence.

## 2022-10-28 NOTE — Assessment & Plan Note (Addendum)
Difficulty maintaining erection. Viagra prievously tried not tolerated well due to body flushed feeling. Discussed treatment options - will Rx trial cialis '10mg'$  QOD PRN, reviewing possible side effects and adverse effects. He knows he cannot take nitrates while on cialis.

## 2022-10-28 NOTE — Assessment & Plan Note (Signed)
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

## 2022-10-28 NOTE — Assessment & Plan Note (Signed)
Elevated BP with prior PCP, today's reading stable. Will continue to monitor.

## 2022-10-28 NOTE — Assessment & Plan Note (Signed)
Recent reassuring colonoscopy per pt report. No evidence of fissure or external hemorrhoids - anticipate int hem as cause. Rx HC suppository, update with effect and if ongoing will refer to GI (in [place] per pt preference).

## 2022-10-28 NOTE — Assessment & Plan Note (Signed)
Trazodone previously effective. Now managing with melatonin.

## 2022-10-28 NOTE — Assessment & Plan Note (Signed)
H/o high triglycerides, previously on Rx medication for this.  Now only managing with OTC fish oil 1 cap BID. Recheck FLP when he returns fasting.

## 2022-10-28 NOTE — Assessment & Plan Note (Signed)
Noted at recent DOT physical. Will update UA when he returns for fasting labs next week.

## 2022-10-30 ENCOUNTER — Telehealth: Payer: Self-pay

## 2022-10-30 ENCOUNTER — Encounter: Payer: Self-pay | Admitting: Family Medicine

## 2022-10-30 NOTE — Telephone Encounter (Signed)
Dr. Darnell Level wants pt's records from [facility] ([place], [place]).    I spoke with pt relaying info above and informed him we need him to come by the office to sign a med release form giving [place] permission to request those records.  Pt verbalizes understanding and will come by later this afternoon.

## 2022-11-02 ENCOUNTER — Encounter: Payer: Self-pay | Admitting: Family Medicine

## 2022-11-03 ENCOUNTER — Other Ambulatory Visit (INDEPENDENT_AMBULATORY_CARE_PROVIDER_SITE_OTHER): Payer: BC Managed Care – PPO

## 2022-11-03 DIAGNOSIS — K625 Hemorrhage of anus and rectum: Secondary | ICD-10-CM | POA: Diagnosis not present

## 2022-11-03 DIAGNOSIS — Z859 Personal history of malignant neoplasm, unspecified: Secondary | ICD-10-CM | POA: Diagnosis not present

## 2022-11-03 DIAGNOSIS — E785 Hyperlipidemia, unspecified: Secondary | ICD-10-CM

## 2022-11-03 DIAGNOSIS — R3129 Other microscopic hematuria: Secondary | ICD-10-CM | POA: Diagnosis not present

## 2022-11-03 LAB — CBC WITH DIFFERENTIAL/PLATELET
Basophils Absolute: 0 10*3/uL (ref 0.0–0.1)
Basophils Relative: 0.3 % (ref 0.0–3.0)
Eosinophils Absolute: 0.1 10*3/uL (ref 0.0–0.7)
Eosinophils Relative: 1.7 % (ref 0.0–5.0)
HCT: 44.2 % (ref 39.0–52.0)
Hemoglobin: 15.2 g/dL (ref 13.0–17.0)
Lymphocytes Relative: 51 % — ABNORMAL HIGH (ref 12.0–46.0)
Lymphs Abs: 3.2 10*3/uL (ref 0.7–4.0)
MCHC: 34.5 g/dL (ref 30.0–36.0)
MCV: 90.8 fl (ref 78.0–100.0)
Monocytes Absolute: 0.5 10*3/uL (ref 0.1–1.0)
Monocytes Relative: 7.5 % (ref 3.0–12.0)
Neutro Abs: 2.5 10*3/uL (ref 1.4–7.7)
Neutrophils Relative %: 39.5 % — ABNORMAL LOW (ref 43.0–77.0)
Platelets: 238 10*3/uL (ref 150.0–400.0)
RBC: 4.87 Mil/uL (ref 4.22–5.81)
RDW: 13.7 % (ref 11.5–15.5)
WBC: 6.2 10*3/uL (ref 4.0–10.5)

## 2022-11-03 LAB — TSH: TSH: 1.7 u[IU]/mL (ref 0.35–5.50)

## 2022-11-03 LAB — LIPID PANEL
Cholesterol: 228 mg/dL — ABNORMAL HIGH (ref 0–200)
HDL: 42.7 mg/dL (ref >39.00)
NonHDL: 185.53
Total CHOL/HDL Ratio: 5
Triglycerides: 222 mg/dL — ABNORMAL HIGH (ref 0.0–149.0)
VLDL: 44.4 mg/dL — ABNORMAL HIGH (ref 0.0–40.0)

## 2022-11-03 LAB — COMPREHENSIVE METABOLIC PANEL
ALT: 49 U/L (ref 0–53)
AST: 32 U/L (ref 0–37)
Albumin: 4.1 g/dL (ref 3.5–5.2)
Alkaline Phosphatase: 81 U/L (ref 39–117)
BUN: 25 mg/dL — ABNORMAL HIGH (ref 6–23)
CO2: 29 mEq/L (ref 19–32)
Calcium: 9 mg/dL (ref 8.4–10.5)
Chloride: 101 mEq/L (ref 96–112)
Creatinine, Ser: 1.1 mg/dL (ref 0.40–1.50)
GFR: 74.52 mL/min (ref >60.00)
Glucose, Bld: 93 mg/dL (ref 70–99)
Potassium: 3.9 mEq/L (ref 3.5–5.1)
Sodium: 136 mEq/L (ref 135–145)
Total Bilirubin: 0.7 mg/dL (ref 0.2–1.2)
Total Protein: 6.9 g/dL (ref 6.0–8.3)

## 2022-11-03 LAB — URINALYSIS, ROUTINE W REFLEX MICROSCOPIC
Bilirubin Urine: NEGATIVE
Hgb urine dipstick: NEGATIVE
Ketones, ur: NEGATIVE
Leukocytes,Ua: NEGATIVE
Nitrite: NEGATIVE
Specific Gravity, Urine: >=1.03 — AB (ref 1.000–1.030)
Total Protein, Urine: NEGATIVE
Urine Glucose: NEGATIVE
Urobilinogen, UA: 0.2 (ref 0.0–1.0)
pH: 6 (ref 5.0–8.0)

## 2022-11-03 LAB — LDL CHOLESTEROL, DIRECT: Direct LDL: 118 mg/dL

## 2022-11-15 ENCOUNTER — Ambulatory Visit: Payer: BC Managed Care – PPO | Attending: Cardiology

## 2022-11-15 DIAGNOSIS — R9431 Abnormal electrocardiogram [ECG] [EKG]: Secondary | ICD-10-CM | POA: Diagnosis not present

## 2022-11-15 LAB — ECHOCARDIOGRAM COMPLETE
Area-P 1/2: 3.34 cm2
S' Lateral: 3.3 cm

## 2022-12-29 ENCOUNTER — Encounter: Payer: Self-pay | Admitting: Family Medicine

## 2023-01-15 ENCOUNTER — Encounter: Payer: Self-pay | Admitting: Family Medicine

## 2023-01-15 ENCOUNTER — Ambulatory Visit: Payer: BC Managed Care – PPO | Admitting: Family Medicine

## 2023-01-15 VITALS — Ht 66.75 in | Wt 178.0 lb

## 2023-01-15 DIAGNOSIS — R059 Cough, unspecified: Secondary | ICD-10-CM

## 2023-01-15 DIAGNOSIS — R0982 Postnasal drip: Secondary | ICD-10-CM

## 2023-01-15 MED ORDER — FLUTICASONE PROPIONATE 50 MCG/ACT NA SUSP: 2.0000 | Freq: Every day | NASAL | 6 refills | Status: DC

## 2023-01-15 MED ORDER — OMEPRAZOLE 40 MG PO CPDR: 40.0000 mg | DELAYED_RELEASE_CAPSULE | Freq: Every day | ORAL | 3 refills | Status: DC

## 2023-01-15 MED ORDER — BENZONATATE 200 MG PO CAPS: 200.0000 mg | ORAL_CAPSULE | Freq: Two times a day (BID) | ORAL | 0 refills | Status: DC | PRN

## 2023-01-15 NOTE — Progress Notes (Unsigned)
Virtual Visit via Telephone Note  I connected with Shane Combs on 01/16/23 at 1608 by telephone and verified that I am speaking with the correct person using two identifiers. Shane Combs is currently located at home and his wife, Gerlene Fee, is currently with him during this visit. The provider, Carollee Leitz, MD, is located in their home at time of visit.  I discussed the limitations, risks, security and privacy concerns of performing an evaluation and management service by telephone and the availability of in person appointments. I also discussed with the patient that there may be a patient responsible charge related to this service. The patient expressed understanding and agreed to proceed.  Subjective: PCP: Ria Bush, MD  Chief Complaint  Patient presents with   Cough    X 2 weeks (productive), congestion. Denies SOB & Chest Pain    Patient reports symptoms of cough x 2 weeks.  Productive of mucus.  Endorses nasal congestion, runny nose, itchy throat and itchy eyes.  Denies any fevers, headaches, shortness of breath at rest or exertional, pleuritic pain, chest pain, palpitations, recent sick contacts or travel.  Endorses cough mostly at night and feels like some wheezing, both worsens with laying down.  Was taking Mucinex but stopped 1 week ago as symptoms improved.  No recent COVID testing.  Currently taking Zyrtec and was previously taking Flonase.  Reports having symptoms similar in the past and was prescribed Tessalon Perles and omeprazole for possible GERD.  ROS: Per HPI  Current Outpatient Medications:    benzonatate (TESSALON) 200 MG capsule, Take 1 capsule (200 mg total) by mouth 2 (two) times daily as needed for cough., Disp: 20 capsule, Rfl: 0   fluticasone (FLONASE) 50 MCG/ACT nasal spray, Place 2 sprays into both nostrils daily., Disp: 16 g, Rfl: 6   Omega-3 Fatty Acids (FISH OIL) 1000 MG CAPS, Take 1 capsule (1,000 mg total) by mouth in the morning and at bedtime.,  Disp: , Rfl: 0   omeprazole (PRILOSEC) 40 MG capsule, Take 1 capsule (40 mg total) by mouth daily., Disp: 30 capsule, Rfl: 3   hydrocortisone (ANUSOL-HC) 25 MG suppository, Place 1 suppository (25 mg total) rectally 2 (two) times daily. (Patient not taking: Reported on 01/15/2023), Disp: 12 suppository, Rfl: 0   tadalafil (CIALIS) 10 MG tablet, Take 1 tablet (10 mg total) by mouth every other day as needed for erectile dysfunction. (Patient not taking: Reported on 01/15/2023), Disp: 10 tablet, Rfl: 1  Observations/Objective: A&O  No respiratory distress or wheezing audible over the phone Mood, judgement, and thought processes all WNL  Assessment and Plan: Postnasal drip Assessment & Plan: Suspect PND given cough at night, increased mucus production and nasal congestion.  Less likely infectious etiology given no fevers or recent sick contacts.  Considered GERD as previoiusly was treated with PPI but not taking now.   Will restart Omeprazole 40 mg daily Flonase 2 spray daily Continue Zyrtec 10 mg daily Humidified air Follow up with PCP if no improvement in symptoms  Orders: -     Omeprazole; Take 1 capsule (40 mg total) by mouth daily.  Dispense: 30 capsule; Refill: 3 -     Fluticasone Propionate; Place 2 sprays into both nostrils daily.  Dispense: 16 g; Refill: 6 -     Benzonatate; Take 1 capsule (200 mg total) by mouth 2 (two) times daily as needed for cough.  Dispense: 20 capsule; Refill: 0    Follow Up Instructions: Return if symptoms worsen or fail to  improve.  I discussed the assessment and treatment plan with the patient. The patient was provided an opportunity to ask questions and all were answered. The patient agreed with the plan and demonstrated an understanding of the instructions.   The patient was advised to call back or seek an in-person evaluation if the symptoms worsen or if the condition fails to improve as anticipated.  The above assessment and management plan was  discussed with the patient. The patient verbalized understanding of and has agreed to the management plan. Patient is aware to call the clinic if symptoms persist or worsen. Patient is aware when to return to the clinic for a follow-up visit. Patient educated on when it is appropriate to go to the emergency department.   Time call ended: 1625  I provided 20 minutes of non-face-to-face time during this encounter.  Carollee Leitz, MD

## 2023-01-15 NOTE — Patient Instructions (Signed)
It was a pleasure meeting you today. Thank you for allowing me to take part in your health care.  Our goals for today as we discussed include:  Start Flonase 2 sprays daily Continue Zyrtec 10 mg daily Start Omeprazole 40 mg daily  Start Tessalon Pearls 200 mg twice a day  If you develop any fevers, chest pain or shortness of breath please call 911 or have someone take you to the ED.    Follow up with PCP if no improvement in symptoms   If you have any questions or concerns, please do not hesitate to call the office at [phone].  I look forward to our next visit and until then take care and stay safe.  Regards,   Carollee Leitz, MD   [facility]

## 2023-01-16 ENCOUNTER — Encounter: Payer: Self-pay | Admitting: Family Medicine

## 2023-01-16 DIAGNOSIS — R0982 Postnasal drip: Secondary | ICD-10-CM | POA: Insufficient documentation

## 2023-01-16 NOTE — Assessment & Plan Note (Signed)
Suspect PND given cough at night, increased mucus production and nasal congestion.  Less likely infectious etiology given no fevers or recent sick contacts.  Considered GERD as previoiusly was treated with PPI but not taking now.   Will restart Omeprazole 40 mg daily Flonase 2 spray daily Continue Zyrtec 10 mg daily Humidified air Follow up with PCP if no improvement in symptoms

## 2023-02-26 ENCOUNTER — Encounter: Payer: Self-pay | Admitting: Family Medicine

## 2023-05-28 ENCOUNTER — Encounter: Payer: Self-pay | Admitting: Family Medicine

## 2023-05-28 ENCOUNTER — Ambulatory Visit: Payer: 59 | Admitting: Family Medicine

## 2023-05-28 VITALS — BP 162/82 | HR 95 | Temp 97.5°F | Ht 67.0 in | Wt 183.0 lb

## 2023-05-28 DIAGNOSIS — K219 Gastro-esophageal reflux disease without esophagitis: Secondary | ICD-10-CM | POA: Diagnosis not present

## 2023-05-28 DIAGNOSIS — Z859 Personal history of malignant neoplasm, unspecified: Secondary | ICD-10-CM

## 2023-05-28 DIAGNOSIS — R5382 Chronic fatigue, unspecified: Secondary | ICD-10-CM

## 2023-05-28 DIAGNOSIS — Z125 Encounter for screening for malignant neoplasm of prostate: Secondary | ICD-10-CM

## 2023-05-28 DIAGNOSIS — E785 Hyperlipidemia, unspecified: Secondary | ICD-10-CM

## 2023-05-28 DIAGNOSIS — G4700 Insomnia, unspecified: Secondary | ICD-10-CM

## 2023-05-28 DIAGNOSIS — K625 Hemorrhage of anus and rectum: Secondary | ICD-10-CM

## 2023-05-28 DIAGNOSIS — I10 Essential (primary) hypertension: Secondary | ICD-10-CM

## 2023-05-28 DIAGNOSIS — Z0000 Encounter for general adult medical examination without abnormal findings: Secondary | ICD-10-CM | POA: Diagnosis not present

## 2023-05-28 DIAGNOSIS — R5383 Other fatigue: Secondary | ICD-10-CM | POA: Insufficient documentation

## 2023-05-28 MED ORDER — MELATONIN 10 MG PO TABS: 1.0000 | ORAL_TABLET | Freq: Every day | ORAL | Status: DC

## 2023-05-28 MED ORDER — AMLODIPINE BESYLATE 5 MG PO TABS: 5.0000 mg | ORAL_TABLET | Freq: Every day | ORAL | 1 refills | Status: DC

## 2023-05-28 NOTE — Assessment & Plan Note (Signed)
Update CBC. No signs of recurrence.

## 2023-05-28 NOTE — Assessment & Plan Note (Signed)
Update FLP when returns fasting.  

## 2023-05-28 NOTE — Assessment & Plan Note (Signed)
Stable period on melatonin 10mg  nightly.

## 2023-05-28 NOTE — Progress Notes (Signed)
Ph: [phone] Fax: [phone]   Patient ID: Shane Combs, male    DOB: 12-Jan-1966, 57 y.o.   MRN: [id number]  This visit was conducted in person.  BP (!) 162/82   Pulse 95   Temp (!) 97.5 F (36.4 C) (Temporal)   Ht 5\' 7"  (1.702 m)   Wt 183 lb (83 kg)   SpO2 96%   BMI 28.66 kg/m   BP Readings from Last 3 Encounters:  05/28/23 (!) 162/82  10/27/22 134/78  01/12/16 124/78  166/100 on repeat testing  CC: CPE Subjective:   HPI: Shane Combs is a 57 y.o. male presenting on 05/28/2023 for Annual Exam (Pt accompanied by wife, Maud Deed. 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 Last saw oncology 2017, no evidence of recurrent disease, rec f/u PRN.   HTN - hasn't been checking BP at home.   Notes worsening hemorrhoids - with bleeding after bowel movement, clots noted. No pain, itching. Notes bump with wiping. Notes anusol suppository was helpful but only temporarily.   Low energy, low sex drive, mild depression. Trouble maintaining erection, no problem obtaining.   GERD with water brash - omeprazole 40mg  wasn't effective so changed to nexium 20mg  OTC with benefit.   Insomnia - managed with melatonin 10mg  at night  Preventative: Colonoscopy - 08/13/[facility] Colonoscopy 10/2020 - 4mm inflammatory polyp removed, healthy ileo-colonic anastomosis present, ext hem (Dr Jerry Caras at [facility]) Prostate cancer screening - yearly screen  Flu shot - yearly at work  Tetanus shot ~2018 Shingrix - discussed, to consider Seat belt use discussed Sunscreen use discussed. No changing mole Dentist q6 mo Eye exam due  Lives with 2nd wife in [place] Edu: HS  From [place]  Occ: Teaching laboratory technician  Activity: gym QOD  Diet: good water, fruits/vegetables daily      Relevant past medical, surgical, family and social history reviewed and updated as indicated. Interim medical history since our last visit  reviewed. Allergies and medications reviewed and updated. Outpatient Medications Prior to Visit  Medication Sig Dispense Refill   esomeprazole (NEXIUM) 20 MG capsule Take 20 mg by mouth daily as needed. OTC     Omega-3 Fatty Acids (FISH OIL) 1000 MG CAPS Take 1 capsule (1,000 mg total) by mouth in the morning and at bedtime.  0   benzonatate (TESSALON) 200 MG capsule Take 1 capsule (200 mg total) by mouth 2 (two) times daily as needed for cough. 20 capsule 0   fluticasone (FLONASE) 50 MCG/ACT nasal spray Place 2 sprays into both nostrils daily. 16 g 6   hydrocortisone (ANUSOL-HC) 25 MG suppository Place 1 suppository (25 mg total) rectally 2 (two) times daily. (Patient not taking: Reported on 01/15/2023) 12 suppository 0   omeprazole (PRILOSEC) 40 MG capsule Take 1 capsule (40 mg total) by mouth daily. 30 capsule 3   tadalafil (CIALIS) 10 MG tablet Take 1 tablet (10 mg total) by mouth every other day as needed for erectile dysfunction. (Patient not taking: Reported on 01/15/2023) 10 tablet 1   No facility-administered medications prior to visit.     Per HPI unless specifically indicated in ROS section below Review of Systems  Constitutional:  Negative for activity change, appetite change, chills, fatigue, fever and unexpected weight change.  HENT:  Negative for hearing loss.   Eyes:  Negative for visual disturbance.  Respiratory:  Positive for chest tightness. Negative for cough, shortness of breath and wheezing.  Cardiovascular:  Negative for chest pain, palpitations and leg swelling.  Gastrointestinal:  Positive for blood in stool, diarrhea (loose stools - food related) and nausea (diet related). Negative for abdominal distention, abdominal pain, constipation and vomiting.  Genitourinary:  Negative for difficulty urinating and hematuria.  Musculoskeletal:  Negative for arthralgias, myalgias and neck pain.  Skin:  Negative for rash.  Neurological:  Positive for headaches. Negative for  dizziness, seizures and syncope.  Hematological:  Negative for adenopathy. Does not bruise/bleed easily.  Psychiatric/Behavioral:  Negative for dysphoric mood. The patient is not nervous/anxious.     Objective:  BP (!) 162/82   Pulse 95   Temp (!) 97.5 F (36.4 C) (Temporal)   Ht 5\' 7"  (1.702 m)   Wt 183 lb (83 kg)   SpO2 96%   BMI 28.66 kg/m   Wt Readings from Last 3 Encounters:  05/28/23 183 lb (83 kg)  01/15/23 178 lb (80.7 kg)  10/27/22 182 lb 9.6 oz (82.8 kg)      Physical Exam Vitals and nursing note reviewed.  Constitutional:      General: He is not in acute distress.    Appearance: Normal appearance. He is well-developed. He is not ill-appearing.  HENT:     Head: Normocephalic and atraumatic.     Right Ear: Hearing, tympanic membrane, ear canal and external ear normal.     Left Ear: Hearing, tympanic membrane, ear canal and external ear normal.     Nose: Nose normal.     Mouth/Throat:     Mouth: Mucous membranes are moist.     Pharynx: Oropharynx is clear. No oropharyngeal exudate or posterior oropharyngeal erythema.  Eyes:     General: No scleral icterus.    Extraocular Movements: Extraocular movements intact.     Conjunctiva/sclera: Conjunctivae normal.     Pupils: Pupils are equal, round, and reactive to light.  Neck:     Thyroid: No thyroid mass or thyromegaly.  Cardiovascular:     Rate and Rhythm: Normal rate and regular rhythm.     Pulses: Normal pulses.          Radial pulses are 2+ on the right side and 2+ on the left side.     Heart sounds: Normal heart sounds. No murmur heard. Pulmonary:     Effort: Pulmonary effort is normal. No respiratory distress.     Breath sounds: Normal breath sounds. No wheezing, rhonchi or rales.  Abdominal:     General: Bowel sounds are normal. There is no distension.     Palpations: Abdomen is soft. There is no mass.     Tenderness: There is no abdominal tenderness. There is no guarding or rebound.     Hernia: No  hernia is present.  Genitourinary:    Rectum: Internal hemorrhoid present. No mass, tenderness, anal fissure or external hemorrhoid.     Comments: small noninflamed left side hemorrhoid, presumed internal Musculoskeletal:        General: Normal range of motion.     Cervical back: Normal range of motion and neck supple.     Right lower leg: No edema.     Left lower leg: No edema.  Lymphadenopathy:     Cervical: No cervical adenopathy.  Skin:    General: Skin is warm and dry.     Findings: No rash.  Neurological:     General: No focal deficit present.     Mental Status: He is alert and oriented to person, place, and time.  Psychiatric:        Mood and Affect: Mood normal.        Behavior: Behavior normal.        Thought Content: Thought content normal.        Judgment: Judgment normal.       Lab Results  Component Value Date   TSH 1.70 11/03/2022    Lab Results  Component Value Date   WBC 6.2 11/03/2022   HGB 15.2 11/03/2022   HCT 44.2 11/03/2022   MCV 90.8 11/03/2022   PLT 238.0 11/03/2022    Lab Results  Component Value Date   CREATININE 1.10 11/03/2022   BUN 25 (H) 11/03/2022   NA 136 11/03/2022   K 3.9 11/03/2022   CL 101 11/03/2022   CO2 29 11/03/2022    Lab Results  Component Value Date   ALT 49 11/03/2022   AST 32 11/03/2022   ALKPHOS 81 11/03/2022   BILITOT 0.7 11/03/2022   No results found for: "VITAMINB12" No results found for: "25OHVITD2", "25OHVITD3", "VD25OH"   Assessment & Plan:   Problem List Items Addressed This Visit     Health maintenance examination - Primary (Chronic)    Preventative protocols reviewed and updated unless pt declined. Discussed healthy diet and lifestyle.       Relevant Orders   Comprehensive metabolic panel   CBC with Differential/Platelet   History of cancer    Update CBC. No signs of recurrence.       GERD (gastroesophageal reflux disease)    Chronic, stable period on nexium 20mg  daily OTC.  Omeprazole 40mg   was ineffective      Relevant Medications   esomeprazole (NEXIUM) 20 MG capsule   Insomnia    Stable period on melatonin 10mg  nightly.       BRBPR (bright red blood per rectum)    Ongoing, presumed related to int hemorrhoids - refer back to GI for evaluation of possible banding treatment.       Relevant Orders   Ambulatory referral to Gastroenterology   Hypertension    Chronic, deteriorated.  Advised buy BP cuff to monitor home readings Recommend limit salt/sodium in diet Start amlodipine 5mg  daily, watching for pedal edema side effect      Relevant Medications   amLODipine (NORVASC) 5 MG tablet   Dyslipidemia    Update FLP when returns fasting.       Relevant Orders   Lipid panel   Fatigue    Fatigue, low energy, low sex drive, mild depression.  Able to obtain erection but difficulty maintaining.  Will check Testosterone levels next am labwork.  Check for other reversible causes of fatigue.       Relevant Orders   Testosterone Total,Free,Bio, Males   Vitamin B12   VITAMIN D 25 Hydroxy (Vit-D Deficiency, Fractures)   Other Visit Diagnoses     Special screening for malignant neoplasm of prostate       Relevant Orders   PSA        Meds ordered this encounter  Medications   Melatonin 10 MG TABS    Sig: Take 1 tablet by mouth at bedtime.   amLODipine (NORVASC) 5 MG tablet    Sig: Take 1 tablet (5 mg total) by mouth daily.    Dispense:  90 tablet    Refill:  1    Orders Placed This Encounter  Procedures   Comprehensive metabolic panel   PSA   CBC with Differential/Platelet   Lipid panel  Standing Status:   Future    Standing Expiration Date:   05/27/2024   Testosterone Total,Free,Bio, Males    Standing Status:   Future    Standing Expiration Date:   05/27/2024   Vitamin B12    Standing Status:   Future    Standing Expiration Date:   05/27/2024   VITAMIN D 25 Hydroxy (Vit-D Deficiency, Fractures)    Standing Status:   Future    Standing Expiration  Date:   05/27/2024   Ambulatory referral to Gastroenterology    Referral Priority:   Routine    Referral Type:   Consultation    Referral Reason:   Specialty Services Required    Number of Visits Requested:   1    Patient Instructions  Considere vacuna contra shingles Haga cita para examen de ojos.  Empieze a revisar su presion arterial en casa - idealmente <140/90.  Laboratorios hoy. Comienze medicina para presion arterial amlodipine 5mg  diarios Lo remitiremos a Industrial/product designer.  Regresar en 4-6 semanas en ayunas primera visita de la maana para seguir alta presion. Elsworth Soho medidas de presion de casa.  Gusto verlo hoy.   Follow up plan: Return in about 6 weeks (around 07/09/2023).  Eustaquio Boyden, MD

## 2023-05-28 NOTE — Assessment & Plan Note (Signed)
Chronic, stable period on nexium 20mg  daily OTC.  Omeprazole 40mg  was ineffective

## 2023-05-28 NOTE — Assessment & Plan Note (Signed)
Fatigue, low energy, low sex drive, mild depression.  Able to obtain erection but difficulty maintaining.  Will check Testosterone levels next am labwork.  Check for other reversible causes of fatigue.

## 2023-05-28 NOTE — Assessment & Plan Note (Addendum)
Ongoing, presumed related to int hemorrhoids - refer back to GI for evaluation of possible banding treatment.

## 2023-05-28 NOTE — Assessment & Plan Note (Signed)
Chronic, deteriorated.  Advised buy BP cuff to monitor home readings Recommend limit salt/sodium in diet Start amlodipine 5mg  daily, watching for pedal edema side effect

## 2023-05-28 NOTE — Assessment & Plan Note (Signed)
Preventative protocols reviewed and updated unless pt declined. Discussed healthy diet and lifestyle.  

## 2023-05-28 NOTE — Patient Instructions (Addendum)
Considere vacuna contra shingles Haga cita para examen de ojos.  Empieze a revisar su presion arterial en casa - idealmente <140/90.  Laboratorios hoy. Comienze medicina para presion arterial amlodipine 5mg  diarios Lo remitiremos a Industrial/product designer.  Regresar en 4-6 semanas en ayunas primera visita de la maana para seguir alta presion. Elsworth Soho medidas de presion de casa.  Gusto verlo hoy.

## 2023-05-29 LAB — COMPREHENSIVE METABOLIC PANEL
ALT: 122 U/L — ABNORMAL HIGH (ref 0–53)
AST: 61 U/L — ABNORMAL HIGH (ref 0–37)
Albumin: 4.2 g/dL (ref 3.5–5.2)
Alkaline Phosphatase: 105 U/L (ref 39–117)
BUN: 17 mg/dL (ref 6–23)
CO2: 25 mEq/L (ref 19–32)
Calcium: 9.5 mg/dL (ref 8.4–10.5)
Chloride: 102 mEq/L (ref 96–112)
Creatinine, Ser: 1.14 mg/dL (ref 0.40–1.50)
GFR: 71.11 mL/min (ref >60.00)
Glucose, Bld: 85 mg/dL (ref 70–99)
Potassium: 4.4 mEq/L (ref 3.5–5.1)
Sodium: 134 mEq/L — ABNORMAL LOW (ref 135–145)
Total Bilirubin: 0.5 mg/dL (ref 0.2–1.2)
Total Protein: 7.1 g/dL (ref 6.0–8.3)

## 2023-05-29 LAB — CBC WITH DIFFERENTIAL/PLATELET
Basophils Absolute: 0 10*3/uL (ref 0.0–0.1)
Basophils Relative: 0.4 % (ref 0.0–3.0)
Eosinophils Absolute: 0.1 10*3/uL (ref 0.0–0.7)
Eosinophils Relative: 1.5 % (ref 0.0–5.0)
HCT: 45.3 % (ref 39.0–52.0)
Hemoglobin: 15.4 g/dL (ref 13.0–17.0)
Lymphocytes Relative: 42.9 % (ref 12.0–46.0)
Lymphs Abs: 2.6 10*3/uL (ref 0.7–4.0)
MCHC: 34 g/dL (ref 30.0–36.0)
MCV: 92.3 fl (ref 78.0–100.0)
Monocytes Absolute: 0.5 10*3/uL (ref 0.1–1.0)
Monocytes Relative: 9 % (ref 3.0–12.0)
Neutro Abs: 2.8 10*3/uL (ref 1.4–7.7)
Neutrophils Relative %: 46.2 % (ref 43.0–77.0)
Platelets: 268 10*3/uL (ref 150.0–400.0)
RBC: 4.91 Mil/uL (ref 4.22–5.81)
RDW: 13 % (ref 11.5–15.5)
WBC: 6.1 10*3/uL (ref 4.0–10.5)

## 2023-05-29 LAB — PSA: PSA: 1.95 ng/mL (ref 0.10–4.00)

## 2023-05-31 ENCOUNTER — Encounter: Payer: Self-pay | Admitting: *Deleted

## 2023-05-31 ENCOUNTER — Other Ambulatory Visit: Payer: Self-pay | Admitting: Family Medicine

## 2023-05-31 DIAGNOSIS — R7401 Elevation of levels of liver transaminase levels: Secondary | ICD-10-CM | POA: Insufficient documentation

## 2023-06-08 ENCOUNTER — Ambulatory Visit: Payer: 59 | Admitting: Internal Medicine

## 2023-06-08 ENCOUNTER — Encounter: Payer: Self-pay | Admitting: Internal Medicine

## 2023-06-08 ENCOUNTER — Encounter: Payer: Self-pay | Admitting: Family Medicine

## 2023-06-08 VITALS — BP 112/74 | HR 82 | Temp 97.6°F | Ht 67.0 in | Wt 186.0 lb

## 2023-06-08 DIAGNOSIS — H00036 Abscess of eyelid left eye, unspecified eyelid: Secondary | ICD-10-CM | POA: Diagnosis not present

## 2023-06-08 MED ORDER — CEPHALEXIN 500 MG PO CAPS: 500.0000 mg | ORAL_CAPSULE | Freq: Three times a day (TID) | ORAL | 0 refills | Status: DC

## 2023-06-08 NOTE — Telephone Encounter (Signed)
error 

## 2023-06-08 NOTE — Assessment & Plan Note (Signed)
Likely inapparent injury Discussed cool vs warm compresses Cephalexin 500 tid (for 5 days)

## 2023-06-08 NOTE — Progress Notes (Signed)
Subjective:    Patient ID: Shane Combs, male    DOB: [birth year], 57 y.o.   MRN: [id number]  HPI Here due to swelling in his left eyelid  Started yesterday--noticed redness Very swollen this morning Eye itself doesn't seem red  No respiratory illness or fever  No known trauma or bites Tried OTC eye drops--no help Tried warm compresses--no clear difference  Current Outpatient Medications on File Prior to Visit  Medication Sig Dispense Refill   amLODipine (NORVASC) 5 MG tablet Take 1 tablet (5 mg total) by mouth daily. 90 tablet 1   esomeprazole (NEXIUM) 20 MG capsule Take 20 mg by mouth daily as needed. OTC     Melatonin 10 MG TABS Take 1 tablet by mouth at bedtime.     Omega-3 Fatty Acids (FISH OIL) 1000 MG CAPS Take 1 capsule (1,000 mg total) by mouth in the morning and at bedtime.  0   No current facility-administered medications on file prior to visit.    No Known Allergies  Past Medical History:  Diagnosis Date   GERD (gastroesophageal reflux disease)    History of kidney stones 2013   Liposarcoma (HCC) 2000   s/p colon resection - [place]   Tension type headache     Past Surgical History:  Procedure Laterality Date   COLON SURGERY  2001   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[place], saw oncologist x 6 yrs   COLONOSCOPY  10/2020   inflammatory polyp, healthy ileocolonic anastomosis, ext hem (Dr Jerry Caras in [place])   KNEE ARTHROSCOPY Left 1990s   meniscal repair   WRIST SURGERY Right 1990s    Family History  Problem Relation Age of Onset   Cancer Neg Hx    Hypertension Neg Hx    Diabetes Neg Hx    CAD Neg Hx    Stroke Neg Hx     Social History   Socioeconomic History   Marital status: Single    Spouse name: Not on file   Number of children: Not on file   Years of education: Not on file   Highest education level: Not on file  Occupational History   Not on file  Tobacco Use   Smoking status: Never   Smokeless tobacco: Never  Substance and Sexual Activity    Alcohol use: No    Alcohol/week: 0.0 standard drinks of alcohol   Drug use: No   Sexual activity: Not on file  Other Topics Concern   Not on file  Social History Narrative   Separated from wife 2016 has 2 daughters and 1 son, all grown, 2 dogs.   Lives with 2nd wife in [place]   Edu: HS   From [place]   Occ: Teaching laboratory technician   Activity: gym QOD   Diet: good water, fruits/vegetables daily   Social Determinants of Health   Financial Resource Strain: Not on file  Food Insecurity: Not on file  Transportation Needs: Not on file  Physical Activity: Not on file  Stress: Not on file  Social Connections: Not on file  Intimate Partner Violence: Not on file   Review of Systems Some blurring in left eye--but not always     Objective:   Physical Exam Constitutional:      Appearance: Normal appearance.  Eyes:     Comments: Right eye normal Left eye--no conjunctival injection. No stye or mass in upper lid but it is warm, swollen and tender  Neurological:     Mental Status: He is alert.  Assessment & Plan:

## 2023-07-06 ENCOUNTER — Ambulatory Visit: Payer: 59 | Admitting: Family Medicine

## 2023-07-09 ENCOUNTER — Encounter: Payer: Self-pay | Admitting: Family Medicine

## 2023-09-14 ENCOUNTER — Ambulatory Visit: Payer: 59 | Admitting: Nurse Practitioner

## 2023-11-05 ENCOUNTER — Other Ambulatory Visit (HOSPITAL_COMMUNITY)
Admission: RE | Admit: 2023-11-05 | Discharge: 2023-11-05 | Disposition: A | Discharge status: Home / Self Care | Payer: 59 | Source: Ambulatory Visit | Attending: Nurse Practitioner | Admitting: Nurse Practitioner

## 2023-11-05 ENCOUNTER — Encounter: Payer: Self-pay | Admitting: Nurse Practitioner

## 2023-11-05 ENCOUNTER — Ambulatory Visit: Payer: 59 | Admitting: Nurse Practitioner

## 2023-11-05 ENCOUNTER — Other Ambulatory Visit: Payer: Self-pay

## 2023-11-05 VITALS — BP 130/80 | HR 100 | Temp 97.9°F | Resp 18 | Ht 66.0 in | Wt 185.5 lb

## 2023-11-05 DIAGNOSIS — Z859 Personal history of malignant neoplasm, unspecified: Secondary | ICD-10-CM | POA: Diagnosis not present

## 2023-11-05 DIAGNOSIS — R3911 Hesitancy of micturition: Secondary | ICD-10-CM | POA: Insufficient documentation

## 2023-11-05 DIAGNOSIS — Z1159 Encounter for screening for other viral diseases: Secondary | ICD-10-CM

## 2023-11-05 DIAGNOSIS — Z23 Encounter for immunization: Secondary | ICD-10-CM | POA: Diagnosis not present

## 2023-11-05 DIAGNOSIS — R300 Dysuria: Secondary | ICD-10-CM | POA: Diagnosis not present

## 2023-11-05 DIAGNOSIS — E785 Hyperlipidemia, unspecified: Secondary | ICD-10-CM

## 2023-11-05 DIAGNOSIS — Z131 Encounter for screening for diabetes mellitus: Secondary | ICD-10-CM

## 2023-11-05 DIAGNOSIS — K219 Gastro-esophageal reflux disease without esophagitis: Secondary | ICD-10-CM

## 2023-11-05 DIAGNOSIS — E559 Vitamin D deficiency, unspecified: Secondary | ICD-10-CM

## 2023-11-05 DIAGNOSIS — I10 Essential (primary) hypertension: Secondary | ICD-10-CM | POA: Diagnosis not present

## 2023-11-05 DIAGNOSIS — C499 Malignant neoplasm of connective and soft tissue, unspecified: Secondary | ICD-10-CM | POA: Insufficient documentation

## 2023-11-05 DIAGNOSIS — Z7689 Persons encountering health services in other specified circumstances: Secondary | ICD-10-CM

## 2023-11-05 DIAGNOSIS — R748 Abnormal levels of other serum enzymes: Secondary | ICD-10-CM

## 2023-11-05 DIAGNOSIS — N529 Male erectile dysfunction, unspecified: Secondary | ICD-10-CM

## 2023-11-05 DIAGNOSIS — Z114 Encounter for screening for human immunodeficiency virus [HIV]: Secondary | ICD-10-CM

## 2023-11-05 LAB — POCT URINALYSIS DIPSTICK
Bilirubin, UA: NEGATIVE
Blood, UA: NEGATIVE
Glucose, UA: NEGATIVE
Ketones, UA: NEGATIVE
Leukocytes, UA: NEGATIVE
Nitrite, UA: NEGATIVE
Protein, UA: NEGATIVE
Spec Grav, UA: 1.02 (ref 1.010–1.025)
Urobilinogen, UA: 0.2 U/dL
pH, UA: 5 (ref 5.0–8.0)

## 2023-11-05 MED ORDER — ESOMEPRAZOLE MAGNESIUM 20 MG PO CPDR: 20.0000 mg | DELAYED_RELEASE_CAPSULE | Freq: Every day | ORAL | 3 refills | Status: AC | PRN

## 2023-11-05 MED ORDER — AMLODIPINE BESYLATE 5 MG PO TABS: 5.0000 mg | ORAL_TABLET | Freq: Every day | ORAL | 3 refills | Status: DC

## 2023-11-05 MED ORDER — VITAMIN D (ERGOCALCIFEROL) 1.25 MG (50000 UNIT) PO CAPS: 50000.0000 [IU] | ORAL_CAPSULE | ORAL | 3 refills | Status: AC

## 2023-11-05 NOTE — Assessment & Plan Note (Signed)
Getting labs, patient denies any concerns.

## 2023-11-05 NOTE — Progress Notes (Signed)
BP 130/80   Pulse 100   Temp 97.9 F (36.6 C) (Oral)   Resp 18   Ht 5\' 6"  (1.676 m)   Wt 185 lb 8 oz (84.1 kg)   SpO2 95%   BMI 29.94 kg/m    Subjective:    Patient ID: Shane Combs, male    DOB: 07/24/1966, 57 y.o.   MRN: [id number]  HPI: Shane Combs is a 56 y.o. male  Chief Complaint  Patient presents with   Establish Care   Establish care: his last physical was years.  Medical history includes HTN, gerd, history liposarcoma.  Family history includes none.  Health Maintenance recently had labs done but will recheck due to elevated liver enzymes.   GERD:was taking nexium 20 mg daily and reports that was helping. . Patient reports he is out of his medication, will send in refills.    Hypertension:  -Medications: amlodipine 5 mg daily -Patient is compliant with above medications and reports no side effects. 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  Last seen by oncology in 2017.      11/05/2023    2:01 PM 05/28/2023    2:11 PM 01/15/2023    4:09 PM 10/27/2022   11:40 AM  Depression screen PHQ 2/9  Decreased Interest 0 0 0 0  Down, Depressed, Hopeless 0 0 0 0  PHQ - 2 Score 0 0 0 0  Altered sleeping    0  Tired, decreased energy    1  Change in appetite    0  Feeling bad or failure about yourself     0  Trouble concentrating    0  Moving slowly or fidgety/restless    0  Suicidal thoughts    0  PHQ-9 Score    1    Elevated liver enzymes: had labs done in June ast 61 alt 122.  Will recheck today.  Patient denies any  alcohol use.   Dysuria/ ED:  patient reports he has to push to urinate and that he feels a sharp pain. He denies any fever.  He says this has been going on for months. He says the pain comes and goes with urination.  He reports that it is harder to urinate. He reports that he has a hard time keeping an erection.   Will get psa and urine dip, urine cytology.  May need to consider urology referral.   HLD:  -Medications: none -Last lipid panel:  Lipid Panel     Component Value Date/Time   CHOL 228 (H) 11/03/2022 0747   CHOL 214 03/06/2013 0000   TRIG 222.0 (H) 11/03/2022 0747   TRIG 325 03/06/2013 0000   HDL 42.70 11/03/2022 0747   CHOLHDL 5 11/03/2022 0747   VLDL 44.4 (H) 11/03/2022 0747   LDLCALC 110 03/06/2013 0000   LDLDIRECT 118.0 11/03/2022 0747    The 10-year ASCVD risk score (Arnett  DK, et al., 2019) is: 11.2%   Values used to calculate the score:     Age: 1 years     Sex: Male     Is Non-Hispanic African American: No     Diabetic: No     Tobacco smoker: No     Systolic Blood Pressure: 130 mmHg     Is BP treated: Yes     HDL Cholesterol: 42.7 mg/dL     Total Cholesterol: 228 mg/dL   Vitamin d deficiency: patient reports he was on vitamin d supplement but has been out and would like a refill.   Relevant past medical, surgical, family and social history reviewed and updated as indicated. Interim medical history since our last visit reviewed. Allergies and medications reviewed and updated.  Review of Systems  Constitutional: Negative for fever or weight change.  Respiratory: Negative for cough and shortness of breath.   Cardiovascular: Negative for chest pain or palpitations.  Gastrointestinal: Negative for abdominal pain, no bowel changes.  Musculoskeletal: Negative for gait problem or joint swelling.  Skin: Negative for rash.  Neurological: Negative for dizziness or headache.  No other specific complaints in a complete review of systems (except as listed in HPI  above).      Objective:    BP 130/80   Pulse 100   Temp 97.9 F (36.6 C) (Oral)   Resp 18   Ht 5\' 6"  (1.676 m)   Wt 185 lb 8 oz (84.1 kg)   SpO2 95%   BMI 29.94 kg/m   Wt Readings from Last 3 Encounters:  11/05/23 185 lb 8 oz (84.1 kg)  06/08/23 186 lb (84.4 kg)  05/28/23 183 lb (83 kg)    Physical Exam  Constitutional: Patient appears well-developed and well-nourished.  No distress.  HEENT: head atraumatic, normocephalic, pupils equal and reactive to light, neck supple Cardiovascular: Normal rate, regular rhythm and normal heart sounds.  No murmur heard. No BLE edema. Pulmonary/Chest: Effort normal and breath sounds normal. No respiratory distress. Abdominal: Soft.  There is no tenderness. Psychiatric: Patient has a normal mood and affect. behavior is normal. Judgment and thought content normal.  Results for orders placed or performed in visit on 11/05/23  POCT urinalysis dipstick  Result Value Ref Range   Color, UA yellow    Clarity, UA clear    Glucose, UA Negative Negative   Bilirubin, UA negative    Ketones, UA negative    Spec Grav, UA 1.020 1.010 - 1.025   Blood, UA negative    pH, UA 5.0 5.0 - 8.0   Protein, UA Negative Negative   Urobilinogen, UA 0.2 0.2 or 1.0 E.U./dL   Nitrite, UA negative    Leukocytes, UA Negative Negative   Appearance clear    Odor none       Assessment & Plan:   Problem List Items Addressed This Visit       Cardiovascular and Mediastinum   Hypertension - Primary    Continue amlodipine 5 mg daily.       Relevant Medications   amLODipine (NORVASC) 5 MG tablet   Other Relevant Orders   CBC with Differential/Platelet   COMPLETE METABOLIC PANEL WITH GFR     Digestive   GERD (gastroesophageal reflux disease)    Continue nexium 20 mg daily      Relevant Medications   esomeprazole (NEXIUM) 20 MG capsule     Other   History of cancer    Getting labs, patient denies any concerns.  Erectile dysfunction    Dyslipidemia    Getting labs.       Relevant Orders   Lipid panel   Other Visit Diagnoses     Need for Tdap vaccination       Relevant Orders   Tdap vaccine greater than or equal to 7yo IM (Completed)   Need for influenza vaccination       Relevant Orders   Flu vaccine trivalent PF, 6mos and older(Flulaval,Afluria,Fluarix,Fluzone) (Completed)   Encounter to establish care       Screening for HIV without presence of risk factors       Relevant Orders   HIV Antibody (routine testing w rflx)   Encounter for hepatitis C screening test for low risk patient       Relevant Orders   Hepatitis C antibody   Elevated liver enzymes       rechecking labs   Relevant Orders   COMPLETE METABOLIC PANEL WITH GFR   Dysuria       getting urine dip, cytology and psa, may consider urology referral   Relevant Orders   POCT urinalysis dipstick (Completed)   TSH   Urine cytology ancillary only   PSA   Screening for diabetes mellitus       Relevant Orders   COMPLETE METABOLIC PANEL WITH GFR   Hemoglobin A1c   Vitamin D deficiency       will recheck level, refill sent.   Relevant Medications   Vitamin D, Ergocalciferol, (DRISDOL) 1.25 MG (50000 UNIT) CAPS capsule   Other Relevant Orders   VITAMIN D 25 Hydroxy (Vit-D Deficiency, Fractures)   Urinary hesitancy       getting urine dip, cytology and psa, may consider urology referral   Relevant Orders   POCT urinalysis dipstick (Completed)   Urine cytology ancillary only   PSA        Follow up plan: Will be moved to Dr. Carlynn Purl for CPE.

## 2023-11-05 NOTE — Assessment & Plan Note (Signed)
-  Continue amlodipine 5 mg daily

## 2023-11-05 NOTE — Assessment & Plan Note (Signed)
Continue nexium 20mg  daily.

## 2023-11-05 NOTE — Assessment & Plan Note (Signed)
Getting labs 

## 2023-11-07 LAB — URINE CYTOLOGY ANCILLARY ONLY
Chlamydia: NEGATIVE
Comment: NEGATIVE
Comment: NORMAL
Neisseria Gonorrhea: NEGATIVE

## 2023-11-07 LAB — LIPID PANEL
Cholesterol: 258 mg/dL — ABNORMAL HIGH (ref <200)
HDL: 47 mg/dL (ref >=40)
LDL Cholesterol (Calc): 159 mg/dL — ABNORMAL HIGH
Non-HDL Cholesterol (Calc): 211 mg/dL — ABNORMAL HIGH (ref <130)
Total CHOL/HDL Ratio: 5.5 (calc) — ABNORMAL HIGH (ref <5.0)
Triglycerides: 337 mg/dL — ABNORMAL HIGH (ref <150)

## 2023-11-07 LAB — CBC WITH DIFFERENTIAL/PLATELET
Absolute Lymphocytes: 1931 {cells}/uL (ref 850–3900)
Absolute Monocytes: 286 {cells}/uL (ref 200–950)
Basophils Absolute: 11 {cells}/uL (ref 0–200)
Basophils Relative: 0.2 %
Eosinophils Absolute: 50 {cells}/uL (ref 15–500)
Eosinophils Relative: 0.9 %
HCT: 46.3 % (ref 38.5–50.0)
Hemoglobin: 15.4 g/dL (ref 13.2–17.1)
MCH: 30.9 pg (ref 27.0–33.0)
MCHC: 33.3 g/dL (ref 32.0–36.0)
MCV: 92.8 fL (ref 80.0–100.0)
MPV: 9.9 fL (ref 7.5–12.5)
Monocytes Relative: 5.2 %
Neutro Abs: 3223 {cells}/uL (ref 1500–7800)
Neutrophils Relative %: 58.6 %
Platelets: 232 10*3/uL (ref 140–400)
RBC: 4.99 10*6/uL (ref 4.20–5.80)
RDW: 12.5 % (ref 11.0–15.0)
Total Lymphocyte: 35.1 %
WBC: 5.5 10*3/uL (ref 3.8–10.8)

## 2023-11-07 LAB — COMPLETE METABOLIC PANEL WITH GFR
AG Ratio: 1.4 (calc) (ref 1.0–2.5)
ALT: 62 U/L — ABNORMAL HIGH (ref 9–46)
AST: 29 U/L (ref 10–35)
Albumin: 4.2 g/dL (ref 3.6–5.1)
Alkaline phosphatase (APISO): 91 U/L (ref 35–144)
BUN: 17 mg/dL (ref 7–25)
CO2: 29 mmol/L (ref 20–32)
Calcium: 9.7 mg/dL (ref 8.6–10.3)
Chloride: 101 mmol/L (ref 98–110)
Creat: 1 mg/dL (ref 0.70–1.30)
Globulin: 3.1 g/dL (ref 1.9–3.7)
Glucose, Bld: 197 mg/dL — ABNORMAL HIGH (ref 65–99)
Potassium: 3.8 mmol/L (ref 3.5–5.3)
Sodium: 137 mmol/L (ref 135–146)
Total Bilirubin: 0.6 mg/dL (ref 0.2–1.2)
Total Protein: 7.3 g/dL (ref 6.1–8.1)
eGFR: 87 mL/min/{1.73_m2} (ref >=60)

## 2023-11-07 LAB — HEPATITIS C ANTIBODY: Hepatitis C Ab: NONREACTIVE

## 2023-11-07 LAB — HEMOGLOBIN A1C
Hgb A1c MFr Bld: 5.9 %{Hb} — ABNORMAL HIGH (ref <5.7)
Mean Plasma Glucose: 123 mg/dL
eAG (mmol/L): 6.8 mmol/L

## 2023-11-07 LAB — VITAMIN D 25 HYDROXY (VIT D DEFICIENCY, FRACTURES): Vit D, 25-Hydroxy: 25 ng/mL — ABNORMAL LOW (ref 30–100)

## 2023-11-07 LAB — TSH: TSH: 1.11 m[IU]/L (ref 0.40–4.50)

## 2023-11-07 LAB — HIV ANTIBODY (ROUTINE TESTING W REFLEX): HIV 1&2 Ab, 4th Generation: NONREACTIVE

## 2023-11-07 LAB — PSA: PSA: 1.33 ng/mL (ref <=4.00)

## 2023-11-08 ENCOUNTER — Other Ambulatory Visit: Payer: Self-pay | Admitting: Nurse Practitioner

## 2023-11-08 DIAGNOSIS — E785 Hyperlipidemia, unspecified: Secondary | ICD-10-CM

## 2023-11-08 DIAGNOSIS — R3911 Hesitancy of micturition: Secondary | ICD-10-CM

## 2023-11-08 DIAGNOSIS — N529 Male erectile dysfunction, unspecified: Secondary | ICD-10-CM

## 2023-11-08 MED ORDER — ROSUVASTATIN CALCIUM 5 MG PO TABS: 5.0000 mg | ORAL_TABLET | Freq: Every day | ORAL | 0 refills | Status: DC

## 2023-11-30 ENCOUNTER — Ambulatory Visit: Payer: 59 | Admitting: Family Medicine

## 2023-12-03 ENCOUNTER — Encounter: Payer: Self-pay | Admitting: Family Medicine

## 2024-02-05 ENCOUNTER — Other Ambulatory Visit: Payer: Self-pay | Admitting: Nurse Practitioner

## 2024-02-05 DIAGNOSIS — E785 Hyperlipidemia, unspecified: Secondary | ICD-10-CM

## 2024-02-05 NOTE — Telephone Encounter (Signed)
Requested Prescriptions  Pending Prescriptions Disp Refills   rosuvastatin (CRESTOR) 5 MG tablet [Pharmacy Med Name: ROSUVASTATIN CALCIUM 5 MG TAB] 90 tablet 0    Sig: TAKE 1 TABLET (5 MG TOTAL) BY MOUTH DAILY.     Cardiovascular:  Antilipid - Statins 2 Failed - 02/05/2024  4:16 PM      Failed - Lipid Panel in normal range within the last 12 months    Cholesterol  Date Value Ref Range Status  11/05/2023 258 (H) <200 mg/dL Final  16/09/9603 540  Final   LDL (calc)  Date Value Ref Range Status  03/06/2013 110  Final   LDL Cholesterol (Calc)  Date Value Ref Range Status  11/05/2023 159 (H) mg/dL (calc) Final    Comment:    Reference range: <100 . Desirable range <100 mg/dL for primary prevention;   <70 mg/dL for patients with CHD or diabetic patients  with > or = 2 CHD risk factors. Marland Kitchen LDL-C is now calculated using the Martin-Hopkins  calculation, which is a validated novel method providing  better accuracy than the Friedewald equation in the  estimation of LDL-C.  Horald Pollen et al. Lenox Ahr. 9811;914(78): 2061-2068  (http://education.[facility].com/faq/FAQ164)    Direct LDL  Date Value Ref Range Status  11/03/2022 118.0 mg/dL Final    Comment:    Optimal:  <100 mg/dLNear or Above Optimal:  100-129 mg/dLBorderline High:  130-159 mg/dLHigh:  160-189 mg/dLVery High:  >190 mg/dL   HDL  Date Value Ref Range Status  11/05/2023 47 > OR = 40 mg/dL Final   Triglycerides  Date Value Ref Range Status  11/05/2023 337 (H) <150 mg/dL Final    Comment:    . If a non-fasting specimen was collected, consider repeat triglyceride testing on a fasting specimen if clinically indicated.  Perry Mount et al. J. of Clin. Lipidol. 2015;9:129-169. .   03/06/2013 325  Final         Passed - Cr in normal range and within 360 days    Creat  Date Value Ref Range Status  11/05/2023 1.00 0.70 - 1.30 mg/dL Final         Passed - Patient is not pregnant      Passed - Valid encounter within  last 12 months    Recent Outpatient Visits           3 months ago Primary hypertension   [facility] Berniece Salines, FNP       Future Appointments             In 4 weeks Alba Cory, MD [facility], [facility]

## 2024-02-08 ENCOUNTER — Ambulatory Visit: Payer: Self-pay | Admitting: Family Medicine

## 2024-02-08 NOTE — Telephone Encounter (Signed)
  Chief Complaint: right flank/abdomen pain Symptoms: right flank pain radiating to right pelvis/abdomen, nausea Frequency: constant since this morning, intermittent since Wednesday Pertinent Negatives: Patient denies vomiting, fever, blood in urine, painful urination/burning,  Disposition: [x] ED /[] Urgent Care (no appt availability in office) / [] Appointment(In office/virtual)/ []  [place] [facility]/ [] Home Care/ [] Refused Recommended Disposition /[] [place] [facility]/ []  Follow-up with PCP Additional Notes: Patient states he has a history of kidney stones, he feels like this is one. He states he has been drinking lots of water and taking Advil. He states in the past he has been able to pass them on his own. Advised ED, explained to patient due to severity of pain and possible kidney stone (sometimes they can get stuck and need further intervention) he should go. Patient states "maybe I can do that later"   Copied from CRM [id number]. Topic: Clinical - Red Word Triage >> Feb 08, 2024  8:55 AM Shon Hale wrote: Red Word that prompted transfer to Nurse Triage: Kidney stone, in severe pain. Reason for Disposition  [1] SEVERE pain (e.g., excruciating, scale 8-10) AND [2] present > 1 hour  Answer Assessment - Initial Assessment Questions 1. LOCATION: "Where does it hurt?" (e.g., left, right)     Right sided flank pain and its now moving to right pelvis.  2. ONSET: "When did the pain start?"     Wednesday.  3. SEVERITY: "How bad is the pain?" (e.g., Scale 1-10; mild, moderate, or severe)   - MILD (1-3): doesn't interfere with normal activities    - MODERATE (4-7): interferes with normal activities or awakens from sleep    - SEVERE (8-10): excruciating pain and patient unable to do normal activities (stays in bed)       "Almost 10/10."  4. PATTERN: "Does the pain come and go, or is it constant?"      Comes and goes, he states last night he was able to sleep and was okay until this  morning.  5. CAUSE: "What do you think is causing the pain?"     Patient states he has had kidney stones in the past and it feels just like that.   6. OTHER SYMPTOMS:  "Do you have any other symptoms?" (e.g., fever, abdomen pain, vomiting, leg weakness, burning with urination, blood in urine)     Nausea, constipation, right sided abdominal/pelvis pain.  Protocols used: Flank Pain-A-AH

## 2024-02-08 NOTE — Telephone Encounter (Signed)
Lvm to let them know we have no availability but will call if something comes open today and that he can check with [facility] or [facility] for availability.

## 2024-03-04 ENCOUNTER — Encounter: Payer: Self-pay | Admitting: Family Medicine

## 2024-03-04 ENCOUNTER — Ambulatory Visit (INDEPENDENT_AMBULATORY_CARE_PROVIDER_SITE_OTHER): Payer: 59 | Admitting: Family Medicine

## 2024-03-04 VITALS — BP 132/86 | HR 94 | Resp 16 | Ht 66.0 in | Wt 183.9 lb

## 2024-03-04 DIAGNOSIS — R399 Unspecified symptoms and signs involving the genitourinary system: Secondary | ICD-10-CM

## 2024-03-04 DIAGNOSIS — E785 Hyperlipidemia, unspecified: Secondary | ICD-10-CM

## 2024-03-04 DIAGNOSIS — Z0000 Encounter for general adult medical examination without abnormal findings: Secondary | ICD-10-CM

## 2024-03-04 DIAGNOSIS — R7303 Prediabetes: Secondary | ICD-10-CM

## 2024-03-04 DIAGNOSIS — Z87442 Personal history of urinary calculi: Secondary | ICD-10-CM

## 2024-03-04 NOTE — Progress Notes (Signed)
 Name: Shane Combs   MRN: [id number]    DOB: December 11, 1966   Date:03/04/2024       Progress Note  Subjective  Chief Complaint  Chief Complaint  Patient presents with   Annual Exam    HPI  Patient presents for annual CPE .   IPSS     Row Name 03/04/24 1259         International Prostate Symptom Score   How often have you had the sensation of not emptying your bladder? Not at All     How often have you had to urinate less than every two hours? Not at All     How often have you found you stopped and started again several times when you urinated? Less than half the time     How often have you found it difficult to postpone urination? Not at All     How often have you had a weak urinary stream? Less than half the time     How often have you had to strain to start urination? Less than half the time     How many times did you typically get up at night to urinate? 2 Times     Total IPSS Score 8       Quality of Life due to urinary symptoms   If you were to spend the rest of your life with your urinary condition just the way it is now how would you feel about that? Mixed              Diet: cutting down on carbohydrates since Fall 2024 Exercise: going to the gym every other day, mixture of strength training and cardio Last Dental Exam: up to date - usually goes to [place]  Last [place] Exam: he will scheduled   Depression: phq 9 is negative    03/04/2024   12:59 PM 11/05/2023    2:01 PM 05/28/2023    2:11 PM 01/15/2023    4:09 PM 10/27/2022   11:40 AM  Depression screen PHQ 2/9  Decreased Interest 0 0 0 0 0  Down, Depressed, Hopeless 0 0 0 0 0  PHQ - 2 Score 0 0 0 0 0  Altered sleeping 0    0  Tired, decreased energy 0    1  Change in appetite 0    0  Feeling bad or failure about yourself  0    0  Trouble concentrating 0    0  Moving slowly or fidgety/restless 0    0  Suicidal thoughts 0    0  PHQ-9 Score 0    1  Difficult doing work/chores Not difficult at all         Hypertension:  BP Readings from Last 3 Encounters:  03/04/24 132/86  11/05/23 130/80  06/08/23 112/74    Obesity: Wt Readings from Last 3 Encounters:  03/04/24 183 lb 14.4 oz (83.4 kg)  11/05/23 185 lb 8 oz (84.1 kg)  06/08/23 186 lb (84.4 kg)   BMI Readings from Last 3 Encounters:  03/04/24 29.68 kg/m  11/05/23 29.94 kg/m  06/08/23 29.13 kg/m     [employer] Visit from 11/05/2023 in [facility]  AUDIT-C Score 0       Married STD testing and prevention (HIV/chl/gon/syphilis):  not applicable Sexual history: one partner, married  Hep C Screening: completed Skin cancer: Discussed monitoring for atypical lesions Colorectal cancer: up to date  Prostate cancer:  yes Lab Results  Component Value Date   PSA 1.33 11/05/2023   PSA 1.95 05/28/2023   PSA 1.06 07/29/2015     Lung cancer:  Low Dose CT Chest recommended if Age 59-80 years, 30 pack-year currently smoking OR have quit w/in 15years. Patient  is not a candidate for screening   AAA: The USPSTF recommends one-time screening with ultrasonography in men ages 59 to 75 years who have ever smoked. Patient   is not a candidate for screening  ECG:  2023  Vaccines: reviewed with the patient.   Advanced Care Planning: A voluntary discussion about advance care planning including the explanation and discussion of advance directives.  Discussed health care proxy and Living will, and the patient was able to identify a health care proxy as wife.  Patient does not have a living will and power of attorney of health care   Patient Active Problem List   Diagnosis Date Noted   Urinary hesitancy 11/08/2023   Transaminitis 05/31/2023   BRBPR (bright red blood per rectum) 10/28/2022   Erectile dysfunction 10/28/2022   Hypertension 10/28/2022   Dyslipidemia 10/28/2022   Loud snoring 10/28/2022   Abnormal EKG 10/28/2022   Adjustment disorder 01/12/2016   Insomnia 01/12/2016   Allergic  rhinitis 06/26/2015   Plantar fasciitis of left foot 06/26/2015   GERD (gastroesophageal reflux disease)    TTH (tension-type headache)    History of cancer 2001    Past Surgical History:  Procedure Laterality Date   COLON SURGERY  2001   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[place], saw oncologist x 6 yrs   COLONOSCOPY  10/2020   inflammatory polyp, healthy ileocolonic anastomosis, ext hem (Dr Jerry Caras in [place])   KNEE ARTHROSCOPY Left 1990s   meniscal repair   WRIST SURGERY Right 1990s    Family History  Problem Relation Age of Onset   Cancer Neg Hx    Hypertension Neg Hx    Diabetes Neg Hx    CAD Neg Hx    Stroke Neg Hx     Social History   Socioeconomic History   Marital status: Married    Spouse name: Not on file   Number of children: 3   Years of education: Not on file   Highest education level: 12th grade  Occupational History   Not on file  Tobacco Use   Smoking status: Never   Smokeless tobacco: Never  Vaping Use   Vaping status: Not on file  Substance and Sexual Activity   Alcohol use: No    Alcohol/week: 0.0 standard drinks of alcohol   Drug use: No   Sexual activity: Yes  Other Topics Concern   Not on file  Social History Narrative   Separated from wife 2016 has 2 daughters and 1 son, all grown, 2 dogs.   Lives with 2nd wife in [place]   Edu: HS   From [place]   Occ: Teaching laboratory technician   Activity: gym QOD   Diet: good water, fruits/vegetables daily      11/18   Establish care. Married 3 children living in [place]   Social Drivers of Health   Financial Resource Strain: Low Risk  (11/03/2023)   Overall Financial Resource Strain (CARDIA)    Difficulty of Paying Living Expenses: Not hard at all  Food Insecurity: No Food Insecurity (11/03/2023)   Hunger Vital Sign    Worried About Running Out of Food in the Last Year: Never true    Ran Out of Food in the Last Year: Never true  Transportation Needs:  No Transportation Needs (11/03/2023)   PRAPARE -  Administrator, Civil Service (Medical): No    Lack of Transportation (Non-Medical): No  Physical Activity: Insufficiently Active (11/03/2023)   Exercise Vital Sign    Days of Exercise per Week: 3 days    Minutes of Exercise per Session: 30 min  Stress: No Stress Concern Present (11/03/2023)   [employer] of Occupational Health - Occupational Stress Questionnaire    Feeling of Stress : Not at all  Social Connections: Unknown (11/03/2023)   Social Connection and Isolation Panel [NHANES]    Frequency of Communication with Friends and Family: More than three times a week    Frequency of Social Gatherings with Friends and Family: Once a week    Attends Religious Services: Patient declined    Database administrator or Organizations: No    Attends Engineer, structural: Not on file    Marital Status: Married  Catering manager Violence: Not on file     Current Outpatient Medications:    amLODipine (NORVASC) 5 MG tablet, Take 1 tablet (5 mg total) by mouth daily., Disp: 90 tablet, Rfl: 3   esomeprazole (NEXIUM) 20 MG capsule, Take 1 capsule (20 mg total) by mouth daily as needed. OTC, Disp: 90 capsule, Rfl: 3   Omega-3 Fatty Acids (FISH OIL) 1000 MG CAPS, Take 1 capsule (1,000 mg total) by mouth in the morning and at bedtime., Disp: , Rfl: 0   rosuvastatin (CRESTOR) 5 MG tablet, TAKE 1 TABLET (5 MG TOTAL) BY MOUTH DAILY., Disp: 90 tablet, Rfl: 0   Vitamin D, Ergocalciferol, (DRISDOL) 1.25 MG (50000 UNIT) CAPS capsule, Take 1 capsule (50,000 Units total) by mouth every 7 (seven) days., Disp: 5 capsule, Rfl: 3  No Known Allergies   ROS  Constitutional: Negative for fever or weight change.  Respiratory: Negative for cough and shortness of breath.   Cardiovascular: Negative for chest pain or palpitations.  Gastrointestinal: Negative for abdominal pain, no bowel changes.  Musculoskeletal: Negative for gait problem or joint swelling.  Skin: Negative for rash.   Neurological: Negative for dizziness or headache.  No other specific complaints in a complete review of systems (except as listed in HPI above).    Objective  Vitals:   03/04/24 1302  BP: 132/86  Pulse: 94  Resp: 16  SpO2: 97%  Weight: 183 lb 14.4 oz (83.4 kg)  Height: 5\' 6"  (1.676 m)    Body mass index is 29.68 kg/m.  Physical Exam  Constitutional: Patient appears well-developed and well-nourished. No distress.  HENT: Head: Normocephalic and atraumatic. Ears: B TMs ok, no erythema or effusion; Nose: Nose normal. Mouth/Throat: Oropharynx is clear and moist. No oropharyngeal exudate.  Eyes: Conjunctivae and EOM are normal. Pupils are equal, round, and reactive to light. No scleral icterus.  Neck: Normal range of motion. Neck supple. No JVD present. No thyromegaly present.  Cardiovascular: Normal rate, regular rhythm and normal heart sounds.  No murmur heard. No BLE edema. Pulmonary/Chest: Effort normal and breath sounds normal. No respiratory distress. Abdominal: Soft. Bowel sounds are normal, no distension. There is no tenderness. no masses MALE GENITALIA: Normal descended testes bilaterally, no masses palpated, no hernias, no lesions, no discharge RECTAL:sending him to urologist  Musculoskeletal: Normal range of motion, no joint effusions. No gross deformities Neurological: he is alert and oriented to person, place, and time. No cranial nerve deficit. Coordination, balance, strength, speech and gait are normal.  Skin: Skin is warm and dry. No  rash noted. No erythema.  Psychiatric: Patient has a normal mood and affect. behavior is normal. Judgment and thought content normal.     Assessment & Plan  1. Adult wellness visit (Primary)  - Hemoglobin A1c - Lipid panel - COMPLETE METABOLIC PANEL WITH GFR  2. Dyslipidemia  - Lipid panel  3. Pre-diabetes  - Hemoglobin A1c  4. History of kidney stones  He is going to [place], advised to ask for rectal exam also   5.  Lower urinary tract symptoms (LUTS)  He is going to [facility]    -Prostate cancer screening and PSA options (with potential risks and benefits of testing vs not testing) were discussed along with recent recs/guidelines. -USPSTF grade A and B recommendations reviewed with patient; age-appropriate recommendations, preventive care, screening tests, etc discussed and encouraged; healthy living encouraged; see AVS for patient education given to patient -Discussed importance of 150 minutes of physical activity weekly, eat two servings of fish weekly, eat one serving of tree nuts ( cashews, pistachios, pecans, almonds.Marland Kitchen) every other day, eat 6 servings of fruit/vegetables daily and drink plenty of water and avoid sweet beverages.  -Reviewed Health Maintenance: yes

## 2024-03-05 ENCOUNTER — Encounter: Payer: Self-pay | Admitting: Family Medicine

## 2024-03-05 LAB — COMPLETE METABOLIC PANEL WITH GFR
AG Ratio: 1.3 (calc) (ref 1.0–2.5)
ALT: 63 U/L — ABNORMAL HIGH (ref 9–46)
AST: 30 U/L (ref 10–35)
Albumin: 4.2 g/dL (ref 3.6–5.1)
Alkaline phosphatase (APISO): 96 U/L (ref 35–144)
BUN: 19 mg/dL (ref 7–25)
CO2: 30 mmol/L (ref 20–32)
Calcium: 9.7 mg/dL (ref 8.6–10.3)
Chloride: 101 mmol/L (ref 98–110)
Creat: 0.99 mg/dL (ref 0.70–1.30)
Globulin: 3.2 g/dL (ref 1.9–3.7)
Glucose, Bld: 128 mg/dL — ABNORMAL HIGH (ref 65–99)
Potassium: 4.1 mmol/L (ref 3.5–5.3)
Sodium: 137 mmol/L (ref 135–146)
Total Bilirubin: 0.5 mg/dL (ref 0.2–1.2)
Total Protein: 7.4 g/dL (ref 6.1–8.1)

## 2024-03-05 LAB — HEMOGLOBIN A1C
Hgb A1c MFr Bld: 6.2 %{Hb} — ABNORMAL HIGH (ref <5.7)
Mean Plasma Glucose: 131 mg/dL
eAG (mmol/L): 7.3 mmol/L

## 2024-03-05 LAB — LIPID PANEL
Cholesterol: 288 mg/dL — ABNORMAL HIGH (ref <200)
HDL: 49 mg/dL (ref >=40)
Non-HDL Cholesterol (Calc): 239 mg/dL — ABNORMAL HIGH (ref <130)
Total CHOL/HDL Ratio: 5.9 (calc) — ABNORMAL HIGH (ref <5.0)
Triglycerides: 404 mg/dL — ABNORMAL HIGH (ref <150)

## 2024-03-25 ENCOUNTER — Ambulatory Visit
Admission: RE | Admit: 2024-03-25 | Discharge: 2024-03-25 | Disposition: A | Discharge status: Home / Self Care | Source: Ambulatory Visit | Attending: Family Medicine | Admitting: Family Medicine

## 2024-03-25 ENCOUNTER — Other Ambulatory Visit: Payer: Self-pay | Admitting: Family Medicine

## 2024-03-25 ENCOUNTER — Other Ambulatory Visit
Admission: RE | Admit: 2024-03-25 | Discharge: 2024-03-25 | Disposition: A | Discharge status: Home / Self Care | Source: Ambulatory Visit | Attending: Family Medicine | Admitting: Family Medicine

## 2024-03-25 ENCOUNTER — Ambulatory Visit: Admitting: Family Medicine

## 2024-03-25 VITALS — BP 130/86 | HR 109 | Temp 99.4°F | Resp 16 | Ht 66.0 in | Wt 178.2 lb

## 2024-03-25 DIAGNOSIS — N200 Calculus of kidney: Secondary | ICD-10-CM

## 2024-03-25 DIAGNOSIS — N179 Acute kidney failure, unspecified: Secondary | ICD-10-CM

## 2024-03-25 DIAGNOSIS — R399 Unspecified symptoms and signs involving the genitourinary system: Secondary | ICD-10-CM | POA: Diagnosis not present

## 2024-03-25 LAB — CBC WITH DIFFERENTIAL/PLATELET
Abs Immature Granulocytes: 0.03 10*3/uL (ref 0.00–0.07)
Basophils Absolute: 0 10*3/uL (ref 0.0–0.1)
Basophils Relative: 0 %
Eosinophils Absolute: 0 10*3/uL (ref 0.0–0.5)
Eosinophils Relative: 0 %
HCT: 42 % (ref 39.0–52.0)
Hemoglobin: 14.7 g/dL (ref 13.0–17.0)
Immature Granulocytes: 0 %
Lymphocytes Relative: 14 %
Lymphs Abs: 1.4 10*3/uL (ref 0.7–4.0)
MCH: 31.2 pg (ref 26.0–34.0)
MCHC: 35 g/dL (ref 30.0–36.0)
MCV: 89.2 fL (ref 80.0–100.0)
Monocytes Absolute: 0.9 10*3/uL (ref 0.1–1.0)
Monocytes Relative: 9 %
Neutro Abs: 8.1 10*3/uL — ABNORMAL HIGH (ref 1.7–7.7)
Neutrophils Relative %: 77 %
Platelets: 223 10*3/uL (ref 150–400)
RBC: 4.71 MIL/uL (ref 4.22–5.81)
RDW: 12.3 % (ref 11.5–15.5)
WBC: 10.5 10*3/uL (ref 4.0–10.5)
nRBC: 0 % (ref 0.0–0.2)

## 2024-03-25 LAB — BASIC METABOLIC PANEL WITH GFR
Anion gap: 8 (ref 5–15)
BUN: 18 mg/dL (ref 6–20)
CO2: 27 mmol/L (ref 22–32)
Calcium: 9.2 mg/dL (ref 8.9–10.3)
Chloride: 100 mmol/L (ref 98–111)
Creatinine, Ser: 1.79 mg/dL — ABNORMAL HIGH (ref 0.61–1.24)
GFR, Estimated: 43 mL/min — ABNORMAL LOW (ref >60)
Glucose, Bld: 121 mg/dL — ABNORMAL HIGH (ref 70–99)
Potassium: 4.1 mmol/L (ref 3.5–5.1)
Sodium: 135 mmol/L (ref 135–145)

## 2024-03-25 MED ORDER — MEPERIDINE HCL 50 MG PO TABS
50.0000 mg | ORAL_TABLET | Freq: Four times a day (QID) | ORAL | 0 refills | Status: DC | PRN
Start: 2024-03-25 — End: 2024-03-26

## 2024-03-25 MED ORDER — CIPROFLOXACIN HCL 250 MG PO TABS: 250.0000 mg | ORAL_TABLET | Freq: Two times a day (BID) | ORAL | 0 refills | Status: DC

## 2024-03-25 MED ORDER — TAMSULOSIN HCL 0.4 MG PO CAPS
0.4000 mg | ORAL_CAPSULE | Freq: Every day | ORAL | 0 refills | Status: DC
Start: 2024-03-25 — End: 2024-03-26

## 2024-03-25 MED ORDER — PROMETHAZINE HCL 12.5 MG PO TABS: 12.5000 mg | ORAL_TABLET | Freq: Four times a day (QID) | ORAL | 0 refills | Status: DC | PRN

## 2024-03-25 NOTE — Telephone Encounter (Signed)
 Copied from CRM [id number]. Topic: Clinical - Medication Refill >> Mar 25, 2024  5:50 PM Eunice Blase wrote: Most Recent Primary Care Visit:  Provider: Alba Cory  Department: [facility]  Visit Type: OFFICE VISIT  Date: 03/25/2024  Medication: meperidine (DEMEROL) 50 MG tablet  Has the patient contacted their pharmacy? Yes (Agent: If no, request that the patient contact the pharmacy for the refill. If patient does not wish to contact the pharmacy document the reason why and proceed with request.) (Agent: If yes, when and what did the pharmacy advise?)Script need to be sent   Is this the correct pharmacy for this prescription? Yes If no, delete pharmacy and type the correct one.  This is the patient's preferred pharmacy:  [id number] [place], [place] [address] [phone]  Has the prescription been filled recently? Yes  Is the patient out of the medication? Yes  Has the patient been seen for an appointment in the last year OR does the patient have an upcoming appointment? Yes  Can we respond through MyChart? Yes  Agent: Please be advised that Rx refills may take up to 3 business days. We ask that you follow-up with your pharmacy.

## 2024-03-25 NOTE — Addendum Note (Signed)
 Addended by: Alba Cory F on: 03/25/2024 05:01 PM   Modules accepted: Orders

## 2024-03-25 NOTE — Progress Notes (Addendum)
 Name: Shane Combs   MRN: [id number]    DOB: [birth year]   Date:03/25/2024       Progress Note  Subjective  Chief Complaint  Chief Complaint  Patient presents with   ER Follow up    Pt went to [facility] and has kidney stone, was given oxycodone/flomax/zofran pt states stopped oxycodone due to palpitations but the pain is bad.   Discussed the use of AI scribe software for clinical note transcription with the patient, who gave verbal consent to proceed.  History of Present Illness Shane Combs is a 58 year old male who presents with severe flank pain and urinary symptoms due to a kidney stone.   He has been experiencing severe left flank pain since March 21, 2024, associated with a 4 mm kidney stone lodged in the left ureter, as confirmed by a CT scan done at [facility]  on March 23, 2024. The pain is described as severe, with a level of 'about ten', and has not improved. The intensity of the pain has prevented him from working, and he expressed that the pain was so severe that even though he went to work yesterday he was not able to do his job well   He has urinary symptoms including burning during urination and fever, although he does not recall the exact temperature. No blood has been passed in the urine, but there is significant discomfort when voiding  He was prescribed oxycodone for pain management but experienced adverse effects such as nausea, palpitations, nasal congestion, and a mild rash, leading to discontinuation. He has been taking Flomax but has run out after being given only five pills. He continues to use nausea medication prn.  He has a history of passing a kidney stone on the right side approximately three weeks ago, but this is his first experience with a stone on the left side.  Despite being referred to a urologist by [facility] he was not able to schedule follow up -number did not work     Patient Active Problem List   Diagnosis Date Noted   Urinary hesitancy  11/08/2023   Transaminitis 05/31/2023   BRBPR (bright red blood per rectum) 10/28/2022   Erectile dysfunction 10/28/2022   Hypertension 10/28/2022   Dyslipidemia 10/28/2022   Loud snoring 10/28/2022   Abnormal EKG 10/28/2022   Adjustment disorder 01/12/2016   Insomnia 01/12/2016   Allergic rhinitis 06/26/2015   Plantar fasciitis of left foot 06/26/2015   GERD (gastroesophageal reflux disease)    TTH (tension-type headache)    History of cancer 2001    Past Surgical History:  Procedure Laterality Date   COLON SURGERY  2001   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[place], saw oncologist x 6 yrs   COLONOSCOPY  10/2020   inflammatory polyp, healthy ileocolonic anastomosis, ext hem (Dr Jerry Caras in [place])   KNEE ARTHROSCOPY Left 1990s   meniscal repair   WRIST SURGERY Right 1990s    Family History  Problem Relation Age of Onset   Cancer Neg Hx    Hypertension Neg Hx    Diabetes Neg Hx    CAD Neg Hx    Stroke Neg Hx     Social History   Tobacco Use   Smoking status: Never   Smokeless tobacco: Never  Substance Use Topics   Alcohol use: No    Alcohol/week: 0.0 standard drinks of alcohol     Current Outpatient Medications:    amLODipine (NORVASC) 5 MG tablet, Take 1 tablet (5 mg total) by mouth  daily., Disp: 90 tablet, Rfl: 3   esomeprazole (NEXIUM) 20 MG capsule, Take 1 capsule (20 mg total) by mouth daily as needed. OTC, Disp: 90 capsule, Rfl: 3   Omega-3 Fatty Acids (FISH OIL) 1000 MG CAPS, Take 1 capsule (1,000 mg total) by mouth in the morning and at bedtime., Disp: , Rfl: 0   rosuvastatin (CRESTOR) 5 MG tablet, TAKE 1 TABLET (5 MG TOTAL) BY MOUTH DAILY., Disp: 90 tablet, Rfl: 0   tamsulosin (FLOMAX) 0.4 MG CAPS capsule, Take by mouth., Disp: , Rfl:    Vitamin D, Ergocalciferol, (DRISDOL) 1.25 MG (50000 UNIT) CAPS capsule, Take 1 capsule (50,000 Units total) by mouth every 7 (seven) days., Disp: 5 capsule, Rfl: 3   oxyCODONE (OXY IR/ROXICODONE) 5 MG immediate release tablet, Take 5 mg by  mouth 4 (four) times daily as needed. (Patient not taking: Reported on 03/25/2024), Disp: , Rfl:   No Known Allergies  I personally reviewed active problem list, medication list, allergies with the patient/caregiver today.   ROS  Ten systems reviewed and is negative except as mentioned in HPI    Objective Physical Exam VITALS: T- 99.4 CONSTITUTIONAL: Patient appears well-developed and well-nourished. Mild distress, could not sit still due to pain HEENT: Head atraumatic, normocephalic, neck supple. CARDIOVASCULAR: Normal rate, regular rhythm and normal heart sounds. No murmur heard. No BLE edema. PULMONARY: Effort normal and breath sounds normal. No respiratory distress. ABDOMINAL: There is no tenderness or distention. Tenderness on left flank. No costovertebral angle tenderness. MUSCULOSKELETAL: Normal gait. Without gross motor or sensory deficit. PSYCHIATRIC: Patient has a normal mood and affect. Behavior is normal. Judgment and thought content normal.  Vitals:   03/25/24 1308 03/25/24 1309  BP: 130/86   Pulse: (!) 124 (!) 109  Resp: 16   SpO2: 97%   Weight: 178 lb 3.2 oz (80.8 kg)   Height: 5\' 6"  (1.676 m)     Body mass index is 28.76 kg/m.  Recent Results (from the past 2160 hours)  Hemoglobin A1c     Status: Abnormal   Collection Time: 03/04/24  1:33 PM  Result Value Ref Range   Hgb A1c MFr Bld 6.2 (H) <5.7 % of total Hgb    Comment: For someone without known diabetes, a hemoglobin  A1c value between 5.7% and 6.4% is consistent with prediabetes and should be confirmed with a  follow-up test. . For someone with known diabetes, a value <7% indicates that their diabetes is well controlled. A1c targets should be individualized based on duration of diabetes, age, comorbid conditions, and other considerations. . This assay result is consistent with an increased risk of diabetes. . Currently, no consensus exists regarding use of hemoglobin A1c for diagnosis of  diabetes for children. .    Mean Plasma Glucose 131 mg/dL   eAG (mmol/L) 7.3 mmol/L  Lipid panel     Status: Abnormal   Collection Time: 03/04/24  1:33 PM  Result Value Ref Range   Cholesterol 288 (H) <200 mg/dL   HDL 49 > OR = 40 mg/dL   Triglycerides 161 (H) <150 mg/dL    Comment: . If a non-fasting specimen was collected, consider repeat triglyceride testing on a fasting specimen if clinically indicated.  Perry Mount et al. J. of Clin. Lipidol. 2015;9:129-169. Marland Kitchen    LDL Cholesterol (Calc)  mg/dL (calc)    Comment: . LDL cholesterol not calculated. Triglyceride levels greater than 400 mg/dL invalidate calculated LDL results. . Reference range: <100 . Desirable range <100 mg/dL for primary  prevention;   <70 mg/dL for patients with CHD or diabetic patients  with > or = 2 CHD risk factors. Marland Kitchen LDL-C is now calculated using the Martin-Hopkins  calculation, which is a validated novel method providing  better accuracy than the Friedewald equation in the  estimation of LDL-C.  Horald Pollen et al. Lenox Ahr. 1610;960(45): 2061-2068  (http://education.[employer].com/faq/FAQ164)    Total CHOL/HDL Ratio 5.9 (H) <5.0 (calc)   Non-HDL Cholesterol (Calc) 239 (H) <130 mg/dL (calc)    Comment: Non-HDL level > or = 220 is very high and may indicate  genetic familial hypercholesterolemia (FH). Clinical  assessment and measurement of blood lipid levels  should be considered for all first-degree relatives  of patients with an FH diagnosis. . For patients with diabetes plus 1 major ASCVD risk  factor, treating to a non-HDL-C goal of <100 mg/dL  (LDL-C of <40 mg/dL) is considered a therapeutic  option.   COMPLETE METABOLIC PANEL WITH GFR     Status: Abnormal   Collection Time: 03/04/24  1:33 PM  Result Value Ref Range   Glucose, Bld 128 (H) 65 - 99 mg/dL    Comment: .            Fasting reference interval . For someone without known diabetes, a glucose value >125 mg/dL indicates that  they may have diabetes and this should be confirmed with a follow-up test. .    BUN 19 7 - 25 mg/dL   Creat 9.81 1.91 - 4.78 mg/dL   BUN/Creatinine Ratio SEE NOTE: 6 - 22 (calc)    Comment:    Not Reported: BUN and Creatinine are within    reference range. .    Sodium 137 135 - 146 mmol/L   Potassium 4.1 3.5 - 5.3 mmol/L   Chloride 101 98 - 110 mmol/L   CO2 30 20 - 32 mmol/L   Calcium 9.7 8.6 - 10.3 mg/dL   Total Protein 7.4 6.1 - 8.1 g/dL   Albumin 4.2 3.6 - 5.1 g/dL   Globulin 3.2 1.9 - 3.7 g/dL (calc)   AG Ratio 1.3 1.0 - 2.5 (calc)   Total Bilirubin 0.5 0.2 - 1.2 mg/dL   Alkaline phosphatase (APISO) 96 35 - 144 U/L   AST 30 10 - 35 U/L   ALT 63 (H) 9 - 46 U/L    Diabetic Foot Exam:     PHQ2/9:    03/04/2024   12:59 PM 11/05/2023    2:01 PM 05/28/2023    2:11 PM 01/15/2023    4:09 PM 10/27/2022   11:40 AM  Depression screen PHQ 2/9  Decreased Interest 0 0 0 0 0  Down, Depressed, Hopeless 0 0 0 0 0  PHQ - 2 Score 0 0 0 0 0  Altered sleeping 0    0  Tired, decreased energy 0    1  Change in appetite 0    0  Feeling bad or failure about yourself  0    0  Trouble concentrating 0    0  Moving slowly or fidgety/restless 0    0  Suicidal thoughts 0    0  PHQ-9 Score 0    1  Difficult doing work/chores Not difficult at all        phq 9 is negative  Fall Risk:    11/05/2023    2:01 PM 05/28/2023    2:10 PM 01/15/2023    4:09 PM  Fall Risk   Falls in the past year? 0  0 0  Number falls in past yr: 0  0  Injury with Fall? 0  0  Risk for fall due to : No Fall Risks    Follow up Falls prevention discussed     Assessment & Plan Left Ureteral Stone 4 mm stone in left ureter causing pain, dysuria, fever. Risk of hydronephrosis and kidney damage. Oxycodone intolerable due to adverse effects. Urgent intervention required. - Send urgent referral to urologist  - Order stat basic metabolic panel to assess kidney function. - Order CT scan to evaluate current status of  the stone. - Advise him to strain urine to collect any passed stones. - Discontinue oxycodone due to adverse effects. - Consider alternative pain management options such as meperidine and promethazine, pending lab results. - Prescribe tamsulosin to aid in stone passage. - Monitor for signs of infection or sepsis, also order CBC  Chronic Kidney Disease Potential exacerbation by ureteral stone. Risk of decreased kidney function due to obstruction and possible infection. NSAIDs contraindicated. - Monitor kidney function with basic metabolic panel. - Avoid NSAIDs like ibuprofen to prevent further kidney damage.  Follow-up Urgent follow-up with urologist needed to address ureteral stone and prevent complications. - Coordinate with urologist for urgent appointment. - Instruct him to wait for lab and CT  results before leaving the hospital. - Contact him with lab results to determine next steps, including potential emergency room visit if labs indicate severe issues.  Reviewed labs done today , white count higher than his baseline and has neutrophils, stone is 3 mm and at the ureteroesical junction and mild hydronephrosis.   Patient was notified of results by phone and will take medications. Adding cipro 250 mg BID for 3 days ( at least until he sees Urologist ). Appointment scheduled for Thursday at 10 AM with Dr. Apolinar Junes

## 2024-03-25 NOTE — Addendum Note (Signed)
 Addended by: Alba Cory F on: 03/25/2024 02:16 PM   Modules accepted: Orders

## 2024-03-26 ENCOUNTER — Other Ambulatory Visit: Payer: Self-pay

## 2024-03-26 DIAGNOSIS — N200 Calculus of kidney: Secondary | ICD-10-CM

## 2024-03-26 DIAGNOSIS — R399 Unspecified symptoms and signs involving the genitourinary system: Secondary | ICD-10-CM

## 2024-03-26 MED ORDER — CIPROFLOXACIN HCL 250 MG PO TABS: 250.0000 mg | ORAL_TABLET | Freq: Two times a day (BID) | ORAL | 0 refills | Status: AC

## 2024-03-26 MED ORDER — TAMSULOSIN HCL 0.4 MG PO CAPS: 0.4000 mg | ORAL_CAPSULE | Freq: Every day | ORAL | 0 refills | Status: DC

## 2024-03-26 MED ORDER — PROMETHAZINE HCL 12.5 MG PO TABS: 12.5000 mg | ORAL_TABLET | Freq: Four times a day (QID) | ORAL | 0 refills | Status: DC | PRN

## 2024-03-26 MED ORDER — MEPERIDINE HCL 50 MG PO TABS: 50.0000 mg | ORAL_TABLET | Freq: Four times a day (QID) | ORAL | 0 refills | Status: DC | PRN

## 2024-03-27 ENCOUNTER — Ambulatory Visit: Admitting: Urology

## 2024-03-27 ENCOUNTER — Other Ambulatory Visit: Payer: Self-pay

## 2024-03-27 VITALS — BP 157/97 | HR 87 | Ht 66.0 in | Wt 177.1 lb

## 2024-03-27 DIAGNOSIS — N131 Hydronephrosis with ureteral stricture, not elsewhere classified: Secondary | ICD-10-CM

## 2024-03-27 DIAGNOSIS — N200 Calculus of kidney: Secondary | ICD-10-CM

## 2024-03-27 DIAGNOSIS — R399 Unspecified symptoms and signs involving the genitourinary system: Secondary | ICD-10-CM

## 2024-03-27 LAB — MICROSCOPIC EXAMINATION

## 2024-03-27 LAB — URINALYSIS, COMPLETE
Bilirubin, UA: NEGATIVE
Glucose, UA: NEGATIVE
Nitrite, UA: NEGATIVE
Specific Gravity, UA: 1.025 (ref 1.005–1.030)
Urobilinogen, Ur: 0.2 mg/dL (ref 0.2–1.0)
pH, UA: 5.5 (ref 5.0–7.5)

## 2024-03-27 MED ORDER — TAMSULOSIN HCL 0.4 MG PO CAPS
0.4000 mg | ORAL_CAPSULE | Freq: Every day | ORAL | 0 refills | Status: AC
  Filled 2024-03-27 – 2024-03-28 (×3): qty 30, 30d supply, fill #0

## 2024-03-27 MED ORDER — HYDROCODONE-ACETAMINOPHEN 5-325 MG PO TABS
1.0000 | ORAL_TABLET | Freq: Four times a day (QID) | ORAL | 0 refills | Status: DC | PRN
  Filled 2024-03-27: qty 20, 3d supply, fill #0

## 2024-03-27 NOTE — Progress Notes (Signed)
 Marcelle Overlie Plume,acting as a scribe for Vanna Scotland, MD.,have documented all relevant documentation on the behalf of Vanna Scotland, MD,as directed by  Vanna Scotland, MD while in the presence of Vanna Scotland, MD.  03/27/24 12:05 PM   Shane Combs Jun 25, 1966 [id number]  Referring provider: Alba Cory, MD [address] [place],  [place] [address]  Chief Complaint  Patient presents with   Establish Care   Hydronephrosis   Nephrolithiasis    HPI: 58 year old male who presents today for further evaluation of left flank pain. He was seen and evaluated by his primary care having had severe left flank pain.  A CT scan performed at [facility] on 03/23/2024 indicated a 4-millimeter left ureteral stone.  He reports associated urinary tract symptoms, including dysuria and subjective fever. He has been taking Flomax and reports some nausea. He denies a personal history of stones prior to this episode, except for a stone passed ten years ago.   A repeat CT scan on 03/25/2024 here at out facility ordered by his primary care that showed a non-obstructing left lower pole stone and a left UVJ stone with perinephric stranding.  Labs performed by Dr. Carlynn Purl indicated an acute rise in creatinine to 1.79, and a urinalysis showed 11 to 30 WBCs, 3 to 10 RBCs, 2+ ketones, and few bacteria.   He reports the pain has shifted from his back to his side and describes it as sharp. He recently passed a 3-millimeter stone from the right side. He is considering treatment options, including waiting, shockwave lithotripsy, or ureteroscopy. He expresses a desire to expedite the process and is concerned about the pain.   He is allergic to oxycodone, which causes palpitations.   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 If you have any questions, ask your nurse or doctor.          amLODipine 5 MG tablet Commonly known as: NORVASC Take 1 tablet (5 mg total) by mouth daily.   ciprofloxacin 250 MG tablet Commonly known as: Cipro Take 1 tablet (250 mg total) by mouth 2 (two) times daily for 3 days.   esomeprazole 20 MG capsule Commonly known as: NEXIUM Take 1 capsule (20 mg total) by mouth daily as needed. OTC   Fish Oil 1000 MG Caps Take 1 capsule (1,000 mg total) by mouth in the morning and at bedtime.   HYDROcodone-acetaminophen 5-325 MG tablet Commonly known as: NORCO/VICODIN Take 1-2 tablets by mouth every 6 (six) hours as needed for moderate pain (pain score 4-6). Started by: Vanna Scotland   meperidine 50 MG tablet Commonly known as: DEMEROL Take 1 tablet (50 mg  total) by mouth every 6 (six) hours as needed for severe pain (pain score 7-10).   promethazine 12.5 MG tablet Commonly known as: PHENERGAN Take 1 tablet (12.5 mg total) by mouth every 6 (six) hours as needed for nausea or vomiting.   rosuvastatin 5 MG tablet Commonly known as: CRESTOR TAKE 1 TABLET (5 MG TOTAL) BY MOUTH DAILY.   tamsulosin 0.4 MG Caps capsule Commonly known as: FLOMAX Take 1 capsule (0.4 mg total) by mouth daily.   Vitamin D (Ergocalciferol) 1.25 MG (50000 UNIT) Caps capsule Commonly known as: DRISDOL Take 1 capsule (50,000 Units total) by mouth every 7 (seven) days.         Allergies:  Allergies  Allergen Reactions   Oxycodone Cough, Nausea Only, Palpitations, Rash and Shortness Of Breath    Family History: Family History  Problem Relation Age of Onset   Cancer Neg Hx    Hypertension Neg Hx    Diabetes Neg Hx    CAD Neg Hx    Stroke Neg Hx     Social History:  reports that he has never smoked. He has never used smokeless tobacco. He reports that he does not drink alcohol and does not use drugs.   Physical Exam: BP (!) 157/97   Pulse 87   Ht 5\' 6"  (1.676 m)   Wt 177 lb 2 oz (80.3 kg)   BMI 28.59 kg/m   Constitutional:  Alert and oriented, No acute distress. HEENT: [place] AT, moist mucus membranes.  Trachea midline, no masses. Neurologic: Grossly intact, no focal deficits, moving all 4 extremities. Psychiatric: Normal mood and affect.  Pertinent Imaging: EXAM: CT ABDOMEN AND PELVIS WITHOUT CONTRAST  TECHNIQUE: Multidetector CT imaging of the abdomen and pelvis was performed following the standard protocol without IV contrast.  RADIATION DOSE REDUCTION: This exam was performed according to the departmental dose-optimization program which includes automated exposure control, adjustment of the mA and/or kV according to patient size and/or use of iterative reconstruction technique.  COMPARISON:  CT abdomen pelvis dated 03/23/2024.  FINDINGS: Evaluation of this exam is limited in the absence of intravenous contrast.  Lower chest: The visualized lung bases are clear.  No intra-abdominal free air or free fluid.  Hepatobiliary: The liver is unremarkable. No biliary ductal dilatation. The gallbladder is unremarkable.  Pancreas: Unremarkable. No pancreatic ductal dilatation or surrounding inflammatory changes.  Spleen: Normal in size without focal abnormality.  Adrenals/Urinary Tract: The adrenal glands are unremarkable. There is a 3 mm stone at the left ureterovesical junction with mild left hydronephrosis. Additional  nonobstructing left renal inferior pole calculi measure up to 6 mm. There is no hydronephrosis or nephrolithiasis on the right. The right ureter and urinary bladder appear unremarkable.  Stomach/Bowel: There is postsurgical changes of bowel with ileotransverse anastomosis. There is no bowel obstruction. Appendectomy.  Vascular/Lymphatic: Mild aortoiliac atherosclerotic disease. The IVC is unremarkable. No portal venous gas. There is no adenopathy.  Reproductive: The prostate gland is enlarged measuring 5 cm in transverse axial diameter. The seminal vesicles are symmetric.  Other: Small fat containing left inguinal hernia.  Musculoskeletal: No acute or significant osseous findings.  IMPRESSION: 1. A 3 mm left UVJ stone with mild left hydronephrosis. 2. Additional nonobstructing left renal inferior pole calculi measure up to 6 mm. 3. No bowel obstruction. 4. Enlarged prostate gland.   Electronically Signed By: Elgie Collard M.D. On: 03/25/2024 15:44   Assessment & Plan:    1. Left UVJ Stone - He has a 4mm stone  at the left UVJ causing significant pain.  - He opted for ureteroscopy scheduled for Monday, with a plan to cancel if the stone passes over the weekend. A strainer was provided to monitor stone passage. - We discussed various treatment options for urolithiasis including observation with or without medical expulsive therapy, shockwave lithotripsy (SWL), ureteroscopy and laser lithotripsy with stent placement, and percutaneous nephrolithotomy.   We discussed that management is based on stone size, location, density, patient co-morbidities, and patient preference.    Stones <71mm in size have a >80% spontaneous passage rate. Data surrounding the use of tamsulosin for medical expulsive therapy is controversial, but meta analyses suggests it is most efficacious for distal stones between 5-36mm in size. Possible side effects include dizziness/lightheadedness, and retrograde  ejaculation.   SWL has a lower stone free rate in a single procedure, but also a lower complication rate compared to ureteroscopy and avoids a stent and associated stent related symptoms. Possible complications include renal hematoma, steinstrasse, and need for additional treatment. We discussed the role of his increased skin to stone distance can lead to decreased efficacy with shockwave lithotripsy.   Ureteroscopy with laser lithotripsy and stent placement has a higher stone free rate than SWL in a single procedure, however increased complication rate including possible infection, ureteral injury, bleeding, and stent related morbidity. Common stent related symptoms include dysuria, urgency/frequency, and flank pain.   After an extensive discussion of the risks and benefits of the above treatment options, He would like to proceed with ureteroscopy.   2. Non-obstructing Left Lower Pole Stone - He has a non-obstructing stone in the left lower pole.  - Discussed potential future treatment with shockwave lithotripsy if it becomes symptomatic.  - Follow-up in six weeks to reassess and discuss treatment options.  3. Acute Kidney Injury - He has an acute rise in creatinine to 1.79.  - Monitor renal function closely, especially post-procedure.  - Ensure adequate hydration and avoid nephrotoxic agents.  4. Pain Management - He reports inadequate pain control and an allergy to oxycodone.  - Prescribed Hydrocodone to be picked up at [facility].   Return in about 6 weeks (around 05/08/2024) for reassessement of the non-obstructing stone and discussion of further management options. Immediate follow up if the stone passes or if symptoms worsen.   [facility] [address], [address] [place], [place] [address] [phone]

## 2024-03-28 ENCOUNTER — Other Ambulatory Visit: Payer: Self-pay

## 2024-03-28 DIAGNOSIS — N201 Calculus of ureter: Secondary | ICD-10-CM

## 2024-03-28 NOTE — Progress Notes (Signed)
 Surgical Physician Order Form [facility] [place]  Dr. Vanna Scotland, MD  * Scheduling expectation : Monday 03/31/24  *Length of Case:   *Clearance needed: no  *Anticoagulation Instructions: Hold all anticoagulants  *Aspirin Instructions: Ok to continue Aspirin  *Post-op visit Date/Instructions:  4 -6 weeks with RUS (PA)  *Diagnosis: Left Ureteral Stone  *Procedure: left Ureteroscopy w/laser lithotripsy & stent placement (09811)   Additional orders: N/A  -Admit type: OUTpatient  -Anesthesia: General  -VTE Prophylaxis Standing Order SCD's       Other:   -Standing Lab Orders Per Anesthesia    Lab other: None  -Standing Test orders EKG/Chest x-ray per Anesthesia       Test other:   - Medications:  Ancef 2gm IV  -Other orders:  N/A

## 2024-03-28 NOTE — Progress Notes (Signed)
 Patient has passed his kidney stone- sent via MyChart- Removing from Scheduling inbasket- Surgeon aware.

## 2024-03-30 LAB — CULTURE, URINE COMPREHENSIVE

## 2024-04-11 ENCOUNTER — Ambulatory Visit: Admitting: Family Medicine

## 2024-05-09 ENCOUNTER — Ambulatory Visit: Admitting: Family Medicine

## 2024-05-09 ENCOUNTER — Encounter: Payer: Self-pay | Admitting: Family Medicine

## 2024-05-09 VITALS — BP 134/82 | HR 94 | Resp 16 | Ht 66.0 in | Wt 181.2 lb

## 2024-05-09 DIAGNOSIS — Z87442 Personal history of urinary calculi: Secondary | ICD-10-CM

## 2024-05-09 DIAGNOSIS — E785 Hyperlipidemia, unspecified: Secondary | ICD-10-CM

## 2024-05-09 DIAGNOSIS — K219 Gastro-esophageal reflux disease without esophagitis: Secondary | ICD-10-CM

## 2024-05-09 DIAGNOSIS — R7303 Prediabetes: Secondary | ICD-10-CM | POA: Diagnosis not present

## 2024-05-09 DIAGNOSIS — I10 Essential (primary) hypertension: Secondary | ICD-10-CM

## 2024-05-09 DIAGNOSIS — E559 Vitamin D deficiency, unspecified: Secondary | ICD-10-CM

## 2024-05-09 MED ORDER — AMLODIPINE BESYLATE 2.5 MG PO TABS: 2.5000 mg | ORAL_TABLET | Freq: Every day | ORAL | 0 refills | Status: AC

## 2024-05-09 MED ORDER — ICOSAPENT ETHYL 1 G PO CAPS: 2.0000 g | ORAL_CAPSULE | Freq: Two times a day (BID) | ORAL | 0 refills | Status: DC

## 2024-05-09 MED ORDER — ROSUVASTATIN CALCIUM 5 MG PO TABS: 5.0000 mg | ORAL_TABLET | Freq: Every day | ORAL | 0 refills | Status: AC

## 2024-05-09 MED ORDER — VASCEPA 1 G PO CAPS: 2.0000 g | ORAL_CAPSULE | Freq: Two times a day (BID) | ORAL | 3 refills | Status: DC

## 2024-05-09 NOTE — Progress Notes (Signed)
 Name: Shane Combs   MRN: [id number]    DOB: 1966/05/19   Date:05/09/2024       Progress Note  Subjective  Chief Complaint  Chief Complaint  Patient presents with   Medical Management of Chronic Issues    Patient was not taking medications due to his previous kidney pain   Discussed the use of AI scribe software for clinical note transcription with the patient, who gave verbal consent to proceed.  History of Present Illness Shane Combs is a 58 year old male with hypertension and prediabetes who presents for a follow-up visit.  He has hypertension and was previously on amlodipine , which he has stopped taking. His blood pressure is currently 134/82 mmHg, and he monitors it at [facility], where it has been stable for the past two weeks.   He has also stopped taking vitamin D  and rosuvastatin  but plans to resume them. He prefers prescription vitamin D  over-the-counter options.  He has prediabetes with an A1c of 5.9. He has reduced carbohydrate intake, cutting down on tortillas, rice, and sweet beverages, and opting for carrot juice instead. No excessive hunger, thirst, or increased urination.  He experiences occasional chest pressure, described as a feeling of something 'killing' him when swallowing. This pressure typically lasts about five minutes and occurs after eating. He has stopped taking Nexium  (esomeprazole ) for reflux but has refills available.  He has a history of elevated triglycerides, which were so high that his LDL cholesterol could not be measured. He has been taking omega-3 supplements over the counter but is open to prescription options. He has stopped taking rosuvastatin  but plans to resume it.  He had a kidney stone episode after his last physical in April, managed by a urologist. He passed a 3 mm stone, but a 6 mm stone remains in his left kidney, causing previous hydronephrosis. No current pain but aware of potential symptoms if the stone passes.  He has a history of BPH and  was previously taking Flomax  for urinary flow. He is currently not taking Flomax  regularly but uses it as needed. He reports difficulty initiating urination if waiting too long but no issues with regular voiding.  He has skin tags and a wart. The wart can be treated with over-the-counter wart removal.    Patient Active Problem List   Diagnosis Date Noted   Urinary hesitancy 11/08/2023   Transaminitis 05/31/2023   BRBPR (bright red blood per rectum) 10/28/2022   Erectile dysfunction 10/28/2022   Hypertension 10/28/2022   Dyslipidemia 10/28/2022   Loud snoring 10/28/2022   Abnormal EKG 10/28/2022   Adjustment disorder 01/12/2016   Insomnia 01/12/2016   Allergic rhinitis 06/26/2015   Plantar fasciitis of left foot 06/26/2015   GERD (gastroesophageal reflux disease)    TTH (tension-type headache)    History of cancer 2001    Past Surgical History:  Procedure Laterality Date   COLON SURGERY  2001   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[place], saw oncologist x 6 yrs   COLONOSCOPY  10/2020   inflammatory polyp, healthy ileocolonic anastomosis, ext hem (Dr Nino in [place])   KNEE ARTHROSCOPY Left 1990s   meniscal repair   WRIST SURGERY Right 1990s    Family History  Problem Relation Age of Onset   Cancer Neg Hx    Hypertension Neg Hx    Diabetes Neg Hx    CAD Neg Hx    Stroke Neg Hx     Social History   Tobacco Use   Smoking status: Never   Smokeless  tobacco: Never  Substance Use Topics   Alcohol use: No    Alcohol/week: 0.0 standard drinks of alcohol     Current Outpatient Medications:    amLODipine  (NORVASC ) 5 MG tablet, Take 1 tablet (5 mg total) by mouth daily. (Patient not taking: Reported on 05/09/2024), Disp: 90 tablet, Rfl: 3   esomeprazole  (NEXIUM ) 20 MG capsule, Take 1 capsule (20 mg total) by mouth daily as needed. OTC (Patient not taking: Reported on 05/09/2024), Disp: 90 capsule, Rfl: 3   HYDROcodone -acetaminophen  (NORCO/VICODIN) 5-325 MG tablet, Take 1-2 tablets by mouth  every 6 (six) hours as needed for moderate pain (pain score 4-6). (Patient not taking: Reported on 05/09/2024), Disp: 20 tablet, Rfl: 0   meperidine  (DEMEROL ) 50 MG tablet, Take 1 tablet (50 mg total) by mouth every 6 (six) hours as needed for severe pain (pain score 7-10). (Patient not taking: Reported on 05/09/2024), Disp: 12 tablet, Rfl: 0   Omega-3 Fatty Acids (FISH OIL ) 1000 MG CAPS, Take 1 capsule (1,000 mg total) by mouth in the morning and at bedtime. (Patient not taking: Reported on 05/09/2024), Disp: , Rfl: 0   promethazine  (PHENERGAN ) 12.5 MG tablet, Take 1 tablet (12.5 mg total) by mouth every 6 (six) hours as needed for nausea or vomiting. (Patient not taking: Reported on 05/09/2024), Disp: 12 tablet, Rfl: 0   rosuvastatin  (CRESTOR ) 5 MG tablet, TAKE 1 TABLET (5 MG TOTAL) BY MOUTH DAILY. (Patient not taking: Reported on 05/09/2024), Disp: 90 tablet, Rfl: 0   tamsulosin  (FLOMAX ) 0.4 MG CAPS capsule, Take 1 capsule (0.4 mg total) by mouth daily. (Patient not taking: Reported on 05/09/2024), Disp: 30 capsule, Rfl: 0   Vitamin D , Ergocalciferol , (DRISDOL ) 1.25 MG (50000 UNIT) CAPS capsule, Take 1 capsule (50,000 Units total) by mouth every 7 (seven) days. (Patient not taking: Reported on 05/09/2024), Disp: 5 capsule, Rfl: 3  Allergies  Allergen Reactions   Oxycodone Cough, Nausea Only, Palpitations, Rash and Shortness Of Breath    I personally reviewed active problem list, medication list, allergies, family history with the patient/caregiver today.   ROS  Ten systems reviewed and is negative except as mentioned in HPI    Objective Physical Exam VITALS: BP- 134/82 CONSTITUTIONAL: Patient appears well-developed and well-nourished. No distress. HEENT: Head atraumatic, normocephalic, neck supple. CARDIOVASCULAR: Normal rate, regular rhythm and normal heart sounds. No murmur heard. No BLE edema. PULMONARY: Effort normal and breath sounds normal. No respiratory distress. ABDOMINAL: There is no  tenderness or distention. MUSCULOSKELETAL: Normal gait. Without gross motor or sensory deficit. PSYCHIATRIC: Patient has a normal mood and affect. Behavior is normal. Judgment and thought content normal. SKIN: Skin tags and wart present.  Vitals:   05/09/24 1524  BP: 134/82  Pulse: 94  Resp: 16  SpO2: 100%  Weight: 181 lb 3.2 oz (82.2 kg)  Height: 5' 6 (1.676 m)    Body mass index is 29.25 kg/m.  Recent Results (from the past 2160 hours)  Hemoglobin A1c     Status: Abnormal   Collection Time: 03/04/24  1:33 PM  Result Value Ref Range   Hgb A1c MFr Bld 6.2 (H) <5.7 % of total Hgb    Comment: For someone without known diabetes, a hemoglobin  A1c value between 5.7% and 6.4% is consistent with prediabetes and should be confirmed with a  follow-up test. . For someone with known diabetes, a value <7% indicates that their diabetes is well controlled. A1c targets should be individualized based on duration of diabetes, age, comorbid conditions,  and other considerations. . This assay result is consistent with an increased risk of diabetes. . Currently, no consensus exists regarding use of hemoglobin A1c for diagnosis of diabetes for children. .    Mean Plasma Glucose 131 mg/dL   eAG (mmol/L) 7.3 mmol/L  Lipid panel     Status: Abnormal   Collection Time: 03/04/24  1:33 PM  Result Value Ref Range   Cholesterol 288 (H) <200 mg/dL   HDL 49 > OR = 40 mg/dL   Triglycerides 595 (H) <150 mg/dL    Comment: . If a non-fasting specimen was collected, consider repeat triglyceride testing on a fasting specimen if clinically indicated.  Veatrice et al. J. of Clin. Lipidol. 2015;9:129-169. SABRA    LDL Cholesterol (Calc)  mg/dL (calc)    Comment: . LDL cholesterol not calculated. Triglyceride levels greater than 400 mg/dL invalidate calculated LDL results. . Reference range: <100 . Desirable range <100 mg/dL for primary prevention;   <70 mg/dL for patients with CHD or diabetic  patients  with > or = 2 CHD risk factors. SABRA LDL-C is now calculated using the Martin-Hopkins  calculation, which is a validated novel method providing  better accuracy than the Friedewald equation in the  estimation of LDL-C.  Gladis APPLETHWAITE et al. SANDREA. 7986;689(80): 2061-2068  (http://education.[facility].com/faq/FAQ164)    Total CHOL/HDL Ratio 5.9 (H) <5.0 (calc)   Non-HDL Cholesterol (Calc) 239 (H) <130 mg/dL (calc)    Comment: Non-HDL level > or = 220 is very high and may indicate  genetic familial hypercholesterolemia (FH). Clinical  assessment and measurement of blood lipid levels  should be considered for all first-degree relatives  of patients with an FH diagnosis. . For patients with diabetes plus 1 major ASCVD risk  factor, treating to a non-HDL-C goal of <100 mg/dL  (LDL-C of <29 mg/dL) is considered a therapeutic  option.   COMPLETE METABOLIC PANEL WITH GFR     Status: Abnormal   Collection Time: 03/04/24  1:33 PM  Result Value Ref Range   Glucose, Bld 128 (H) 65 - 99 mg/dL    Comment: .            Fasting reference interval . For someone without known diabetes, a glucose value >125 mg/dL indicates that they may have diabetes and this should be confirmed with a follow-up test. .    BUN 19 7 - 25 mg/dL   Creat 9.00 9.29 - 8.69 mg/dL   BUN/Creatinine Ratio SEE NOTE: 6 - 22 (calc)    Comment:    Not Reported: BUN and Creatinine are within    reference range. .    Sodium 137 135 - 146 mmol/L   Potassium 4.1 3.5 - 5.3 mmol/L   Chloride 101 98 - 110 mmol/L   CO2 30 20 - 32 mmol/L   Calcium  9.7 8.6 - 10.3 mg/dL   Total Protein 7.4 6.1 - 8.1 g/dL   Albumin 4.2 3.6 - 5.1 g/dL   Globulin 3.2 1.9 - 3.7 g/dL (calc)   AG Ratio 1.3 1.0 - 2.5 (calc)   Total Bilirubin 0.5 0.2 - 1.2 mg/dL   Alkaline phosphatase (APISO) 96 35 - 144 U/L   AST 30 10 - 35 U/L   ALT 63 (H) 9 - 46 U/L  Basic metabolic panel with GFR     Status: Abnormal   Collection Time: 03/25/24   2:05 PM  Result Value Ref Range   Sodium 135 135 - 145 mmol/L   Potassium 4.1 3.5 -  5.1 mmol/L   Chloride 100 98 - 111 mmol/L   CO2 27 22 - 32 mmol/L   Glucose, Bld 121 (H) 70 - 99 mg/dL    Comment: Glucose reference range applies only to samples taken after fasting for at least 8 hours.   BUN 18 6 - 20 mg/dL   Creatinine, Ser 8.20 (H) 0.61 - 1.24 mg/dL   Calcium  9.2 8.9 - 10.3 mg/dL   GFR, Estimated 43 (L) >60 mL/min    Comment: (NOTE) Calculated using the CKD-EPI Creatinine Equation (2021)    Anion gap 8 5 - 15    Comment: Performed at [facility], [address] [place], [place] [address]  CBC with Differential/Platelet     Status: Abnormal   Collection Time: 03/25/24  2:05 PM  Result Value Ref Range   WBC 10.5 4.0 - 10.5 K/uL   RBC 4.71 4.22 - 5.81 MIL/uL   Hemoglobin 14.7 13.0 - 17.0 g/dL   HCT 57.9 60.9 - 47.9 %   MCV 89.2 80.0 - 100.0 fL   MCH 31.2 26.0 - 34.0 pg   MCHC 35.0 30.0 - 36.0 g/dL   RDW 87.6 88.4 - 84.4 %   Platelets 223 150 - 400 K/uL   nRBC 0.0 0.0 - 0.2 %   Neutrophils Relative % 77 %   Neutro Abs 8.1 (H) 1.7 - 7.7 K/uL   Lymphocytes Relative 14 %   Lymphs Abs 1.4 0.7 - 4.0 K/uL   Monocytes Relative 9 %   Monocytes Absolute 0.9 0.1 - 1.0 K/uL   Eosinophils Relative 0 %   Eosinophils Absolute 0.0 0.0 - 0.5 K/uL   Basophils Relative 0 %   Basophils Absolute 0.0 0.0 - 0.1 K/uL   Immature Granulocytes 0 %   Abs Immature Granulocytes 0.03 0.00 - 0.07 K/uL    Comment: Performed at [facility], [address] [place], [place] [address]  Urinalysis, Complete     Status: Abnormal   Collection Time: 03/27/24  9:52 AM  Result Value Ref Range   Specific Gravity, UA 1.025 1.005 - 1.030   pH, UA 5.5 5.0 - 7.5   Color, UA Yellow Yellow   Appearance Ur Clear Clear   Leukocytes,UA Trace (A) Negative   Protein,UA 1+ (A) Negative/Trace   Glucose, UA Negative Negative   Ketones, UA 2+ (A) Negative   RBC, UA 1+ (A) Negative   Bilirubin,  UA Negative Negative   Urobilinogen, Ur 0.2 0.2 - 1.0 mg/dL   Nitrite, UA Negative Negative   Microscopic Examination See below:   Microscopic Examination     Status: Abnormal   Collection Time: 03/27/24  9:52 AM   Urine  Result Value Ref Range   WBC, UA 11-30 (A) 0 - 5 /hpf   RBC, Urine 3-10 (A) 0 - 2 /hpf   Epithelial Cells (non renal) 0-10 0 - 10 /hpf   Casts Present (A) None seen /lpf   Cast Type Hyaline casts N/A   Mucus, UA Present (A) Not Estab.   Bacteria, UA Few None seen/Few  CULTURE, URINE COMPREHENSIVE     Status: None   Collection Time: 03/27/24  2:18 PM   Specimen: Urine   UR  Result Value Ref Range   Urine Culture, Comprehensive Final report    Organism ID, Bacteria Comment     Comment: No growth in 36 - 48 hours.    Diabetic Foot Exam:     PHQ2/9:    05/09/2024  3:18 PM 03/04/2024   12:59 PM 11/05/2023    2:01 PM 05/28/2023    2:11 PM 01/15/2023    4:09 PM  Depression screen PHQ 2/9  Decreased Interest 0 0 0 0 0  Down, Depressed, Hopeless 0 0 0 0 0  PHQ - 2 Score 0 0 0 0 0  Altered sleeping  0     Tired, decreased energy  0     Change in appetite  0     Feeling bad or failure about yourself   0     Trouble concentrating  0     Moving slowly or fidgety/restless  0     Suicidal thoughts  0     PHQ-9 Score  0     Difficult doing work/chores  Not difficult at all       phq 9 is negative  Fall Risk:    05/09/2024    3:18 PM 11/05/2023    2:01 PM 05/28/2023    2:10 PM 01/15/2023    4:09 PM  Fall Risk   Falls in the past year? 0 0 0 0  Number falls in past yr: 0 0  0  Injury with Fall? 0 0  0  Risk for fall due to : No Fall Risks No Fall Risks    Follow up Falls prevention discussed;Education provided;Falls evaluation completed Falls prevention discussed       Assessment & Plan Kidney stones with left hydronephrosis Passed a 3 mm stone; 6 mm stone remains in left kidney. No current pain, but potential for future pain if stone moves. -  Monitor for symptoms of kidney stone passage. - Contact urologist if pain occurs.  Benign prostatic hyperplasia Difficulty initiating urination if delayed. Previously used Flomax , not currently taking it regularly. - Consider resuming Flomax  as needed for urinary flow.  Hypertension Blood pressure 134/82 mmHg. Previously stopped all medications. Decision to reduce amlodipine  dose to prevent spikes while minimizing medication use. - Reduce amlodipine  dose to 2.5 mg daily by cutting current 5 mg tablets in half until new prescription is obtained.  Hyperlipidemia with high triglycerides Triglycerides too high to measure LDL. Previously on rosuvastatin , stopped all medications. Decision to prescribe Vascepa  and provide a voucher to reduce cost. - Resume rosuvastatin  daily. - Prescribe Vascepa , 2 capsules twice daily, to lower triglycerides. - Provide a voucher for Vascepa  to reduce cost.  Prediabetes A1c at 5.9%. Reducing carbohydrate intake. No current symptoms of diabetes. - Continue dietary modifications to reduce carbohydrate intake. - Monitor for symptoms of diabetes such as increased hunger, thirst, or urination.  Gastroesophageal reflux disease (GERD) Intermittent chest pressure likely related to GERD. Previously on Nexium , stopped taking it. Decision to resume Nexium . - Resume Nexium  (esomeprazole ) daily in the morning.

## 2024-05-19 ENCOUNTER — Other Ambulatory Visit (HOSPITAL_COMMUNITY): Payer: Self-pay

## 2024-05-19 ENCOUNTER — Other Ambulatory Visit: Payer: Self-pay | Admitting: Family Medicine

## 2024-05-19 ENCOUNTER — Telehealth: Payer: Self-pay | Admitting: Pharmacy Technician

## 2024-05-19 MED ORDER — OMEGA-3-ACID ETHYL ESTERS 1 G PO CAPS: 2.0000 g | ORAL_CAPSULE | Freq: Two times a day (BID) | ORAL | 1 refills | Status: AC

## 2024-05-19 NOTE — Telephone Encounter (Signed)
 Please send new script

## 2024-05-19 NOTE — Telephone Encounter (Signed)
 Pharmacy Patient Advocate Encounter   Received notification from Onbase that prior authorization for VASCEPA  1 GM CAPSULE is required/requested.   Insurance verification completed.   The patient is insured through [facility] .   Per test claim:     Vascepa  is not on the plan formulary, above are alternate options

## 2024-05-20 ENCOUNTER — Other Ambulatory Visit (HOSPITAL_COMMUNITY): Payer: Self-pay

## 2024-05-26 ENCOUNTER — Other Ambulatory Visit (HOSPITAL_COMMUNITY): Payer: Self-pay

## 2024-05-30 ENCOUNTER — Other Ambulatory Visit (HOSPITAL_COMMUNITY): Payer: Self-pay

## 2024-06-17 ENCOUNTER — Other Ambulatory Visit (HOSPITAL_COMMUNITY): Payer: Self-pay

## 2024-08-12 ENCOUNTER — Ambulatory Visit: Payer: Self-pay | Admitting: Family Medicine

## 2025-03-06 ENCOUNTER — Encounter: Admitting: Family Medicine
